# Patient Record
Sex: Female | Born: 1965 | Race: White | Hispanic: No | Smoking: Former smoker
Health system: Southern US, Community
[De-identification: ages and names within clinical notes are randomized; demographics above are authoritative.]

## PROBLEM LIST (undated history)

## (undated) DIAGNOSIS — Z8719 Personal history of other diseases of the digestive system: Secondary | ICD-10-CM

## (undated) DIAGNOSIS — D649 Anemia, unspecified: Secondary | ICD-10-CM

## (undated) DIAGNOSIS — E559 Vitamin D deficiency, unspecified: Secondary | ICD-10-CM

## (undated) DIAGNOSIS — K644 Residual hemorrhoidal skin tags: Secondary | ICD-10-CM

## (undated) DIAGNOSIS — T7840XA Allergy, unspecified, initial encounter: Secondary | ICD-10-CM

## (undated) DIAGNOSIS — M1990 Unspecified osteoarthritis, unspecified site: Secondary | ICD-10-CM

## (undated) DIAGNOSIS — Z9884 Bariatric surgery status: Secondary | ICD-10-CM

## (undated) DIAGNOSIS — K219 Gastro-esophageal reflux disease without esophagitis: Secondary | ICD-10-CM

## (undated) DIAGNOSIS — F419 Anxiety disorder, unspecified: Secondary | ICD-10-CM

## (undated) DIAGNOSIS — F329 Major depressive disorder, single episode, unspecified: Secondary | ICD-10-CM

## (undated) DIAGNOSIS — Z87898 Personal history of other specified conditions: Secondary | ICD-10-CM

## (undated) DIAGNOSIS — E079 Disorder of thyroid, unspecified: Secondary | ICD-10-CM

## (undated) DIAGNOSIS — F32A Depression, unspecified: Secondary | ICD-10-CM

## (undated) HISTORY — PX: OTHER SURGICAL HISTORY: SHX169

## (undated) HISTORY — PX: SPHINCTEROTOMY: SHX5279

## (undated) HISTORY — DX: Disorder of thyroid, unspecified: E07.9

## (undated) HISTORY — PX: ABDOMINAL HYSTERECTOMY: SHX81

## (undated) HISTORY — DX: Residual hemorrhoidal skin tags: K64.4

## (undated) HISTORY — DX: Personal history of other diseases of the digestive system: Z87.19

## (undated) HISTORY — DX: Anemia, unspecified: D64.9

## (undated) HISTORY — DX: Major depressive disorder, single episode, unspecified: F32.9

## (undated) HISTORY — PX: TOOTH EXTRACTION: SUR596

## (undated) HISTORY — PX: BREAST SURGERY: SHX581

## (undated) HISTORY — DX: Personal history of other specified conditions: Z87.898

## (undated) HISTORY — DX: Bariatric surgery status: Z98.84

## (undated) HISTORY — DX: Anxiety disorder, unspecified: F41.9

## (undated) HISTORY — PX: GASTRIC BYPASS: SHX52

## (undated) HISTORY — DX: Vitamin D deficiency, unspecified: E55.9

## (undated) HISTORY — DX: Depression, unspecified: F32.A

## (undated) HISTORY — DX: Unspecified osteoarthritis, unspecified site: M19.90

## (undated) HISTORY — DX: Gastro-esophageal reflux disease without esophagitis: K21.9

## (undated) HISTORY — PX: UPPER GASTROINTESTINAL ENDOSCOPY: SHX188

## (undated) HISTORY — DX: Allergy, unspecified, initial encounter: T78.40XA

---

## 1993-05-17 HISTORY — PX: COLONOSCOPY: SHX174

## 1997-04-16 HISTORY — PX: CHOLECYSTECTOMY: SHX55

## 2001-05-17 HISTORY — PX: GASTRIC BYPASS: SHX52

## 2016-07-16 ENCOUNTER — Encounter: Payer: Self-pay | Admitting: Family Medicine

## 2016-08-24 ENCOUNTER — Ambulatory Visit: Payer: Self-pay | Admitting: Physician Assistant

## 2017-08-05 DIAGNOSIS — F900 Attention-deficit hyperactivity disorder, predominantly inattentive type: Secondary | ICD-10-CM | POA: Diagnosis not present

## 2017-08-05 DIAGNOSIS — R69 Illness, unspecified: Secondary | ICD-10-CM | POA: Diagnosis not present

## 2017-09-11 NOTE — Progress Notes (Deleted)
   Subjective:    Patient ID: Laurie Arnold, female    DOB: [birth year], 52 y.o.   MRN: [id number]  HPI:  Laurie Arnold is here to establish as a new pt.  She is a pleasant 52 year old female. PMH:     Review of Systems     Objective:   Physical Exam        Assessment & Plan:

## 2017-09-12 ENCOUNTER — Ambulatory Visit: Payer: Self-pay | Admitting: Adult Health

## 2017-10-18 ENCOUNTER — Encounter: Payer: Self-pay | Admitting: Adult Health

## 2017-10-18 ENCOUNTER — Ambulatory Visit (INDEPENDENT_AMBULATORY_CARE_PROVIDER_SITE_OTHER): Payer: 59 | Admitting: Adult Health

## 2017-10-18 VITALS — BP 122/82 | HR 82 | Ht 63.5 in | Wt 198.2 lb

## 2017-10-18 DIAGNOSIS — R5383 Other fatigue: Secondary | ICD-10-CM | POA: Diagnosis not present

## 2017-10-18 DIAGNOSIS — K219 Gastro-esophageal reflux disease without esophagitis: Secondary | ICD-10-CM | POA: Insufficient documentation

## 2017-10-18 DIAGNOSIS — Z0000 Encounter for general adult medical examination without abnormal findings: Secondary | ICD-10-CM | POA: Diagnosis not present

## 2017-10-18 DIAGNOSIS — R7989 Other specified abnormal findings of blood chemistry: Secondary | ICD-10-CM | POA: Insufficient documentation

## 2017-10-18 DIAGNOSIS — R945 Abnormal results of liver function studies: Secondary | ICD-10-CM | POA: Diagnosis not present

## 2017-10-18 DIAGNOSIS — K644 Residual hemorrhoidal skin tags: Secondary | ICD-10-CM | POA: Diagnosis not present

## 2017-10-18 DIAGNOSIS — H5789 Other specified disorders of eye and adnexa: Secondary | ICD-10-CM | POA: Diagnosis not present

## 2017-10-18 DIAGNOSIS — F909 Attention-deficit hyperactivity disorder, unspecified type: Secondary | ICD-10-CM | POA: Diagnosis not present

## 2017-10-18 DIAGNOSIS — Z6826 Body mass index (BMI) 26.0-26.9, adult: Secondary | ICD-10-CM | POA: Insufficient documentation

## 2017-10-18 DIAGNOSIS — Z6834 Body mass index (BMI) 34.0-34.9, adult: Secondary | ICD-10-CM

## 2017-10-18 DIAGNOSIS — R69 Illness, unspecified: Secondary | ICD-10-CM | POA: Diagnosis not present

## 2017-10-18 NOTE — Assessment & Plan Note (Signed)
Gastri Bypass 2004 She has gained >40 lbs in the last year Increase water intake, strive for at least 70 ounces/day.   Follow Heart Healthy diet Increase regular exercise.  Recommend at least 30 minutes daily, 5 days per week of walking, jogging, biking, swimming, YouTube/Pinterest workout videos. Referral placed to Nutritionist.

## 2017-10-18 NOTE — Assessment & Plan Note (Signed)
Intermittent OU discharge Recommended to see established Ophthalmologist

## 2017-10-18 NOTE — Assessment & Plan Note (Addendum)
>>  ASSESSMENT AND PLAN FOR OVERWEIGHT WITH BODY MASS INDEX (BMI) OF 26 TO 26.9 IN ADULT WRITTEN ON 10/18/2017  4:38 PM BY Desire Fulp D, NP  She has been on Adderral in past, did not tolerate well. She has been Vyvanse 70mg  QD for >5 years   >>ASSESSMENT AND PLAN FOR BMI 34.0-34.9,ADULT WRITTEN ON 10/18/2017  4:30 PM BY Kito Cuffe, Jinny Blossom, NP  Claudell Kyle Bypass 2004 She has gained >40 lbs in the last year Increase water intake, strive for at least 70 ounces/day.   Follow Heart Healthy diet Increase regular exercise.  Recommend at least 30 minutes daily, 5 days per week of walking, jogging, biking, swimming, YouTube/Pinterest workout videos. Referral placed to Nutritionist.

## 2017-10-18 NOTE — Assessment & Plan Note (Signed)
Hx of elevated LFTs She currently takes 650mg  Acetaminophen Q6H- been taking on/off for years

## 2017-10-18 NOTE — Progress Notes (Addendum)
Subjective:    Patient ID: Laurie Arnold, female    DOB: 1965/05/31, 52 y.o.   MRN: [id number]  HPI: Laurie Arnold is here to establish as a new pt.  She is a pleasant 52 year old female.   PMH:  Anxiety/depression- currently on bupropion 150mg  QD, buspirone 10mg  TID ADHD- did not tolerate Adderral well, currently on Vyvanse 70 mg QD Intermittent OU discharge- she has established Ophthalmologist. Gastric Bypass 2004- reports recent wt gain >40 lbs in last year. Reports frequent loose, clay colored stools. GERD- been on ranitidine 150mg  QD for several yrs 1998- Grave's Disease  Radiation Therapy She has been managed on various dosages of Levothyroxine, last contact with Endocrinology was early 2000. She reports Behavioral Health took over Levothyroxine in 2000s. Currently on Levothyroxine 200mcg  Hospitalized 2015 for bacteremia, following an EGD a few days prior to admission. Hx of elevated LFTs, currently reports frequent Acetaminophen use to treat generalized arthritis. She reports constant, non-productive cough that sig worsens at night. She denies plain water intake, prefers to hydrate with Diet Mnt. Dew She eats a diet rich in saturated fat/CHO She denies formal exercise She denies tobacco/ETOH use She works as Psychologist, educationalpalliative/hospice RN- third shift/weekends She has not had regular health care in years   Patient Care Team    Relationship Specialty Notifications Start End  Valdis Bevill, Jinny BlossomKaty D, NP PCP - General Family Medicine  08/05/17     Patient Active Problem List   Diagnosis Date Noted  . Healthcare maintenance 10/18/2017  . BMI 34.0-34.9,adult 10/18/2017  . External hemorrhoids 10/18/2017  . Attention deficit hyperactivity disorder (ADHD) 10/18/2017  . Chronic GERD 10/18/2017  . Elevated LFTs 10/18/2017     Past Medical History:  Diagnosis Date  . Thyroid disease    hypothyr     Past Surgical History:  Procedure Laterality Date  . ABDOMINAL HYSTERECTOMY    . BREAST  SURGERY     augmentation  . CESAREAN SECTION    . CHOLECYSTECTOMY    . GASTRIC BYPASS    . SPHINCTEROTOMY    . thyroid iodine radiation       Family History  Problem Relation Age of Onset  . Depression Mother   . Heart attack Mother   . Healthy Father   . Healthy Brother      Social History   Substance and Sexual Activity  Drug Use Never     Social History   Substance and Sexual Activity  Alcohol Use Never  . Frequency: Never     Social History   Tobacco Use  Smoking Status Former Smoker  . Packs/day: 0.25  . Years: 7.00  . Pack years: 1.75  . Types: Cigarettes  . Last attempt to quit: 05/17/1993  . Years since quitting: 24.4  Smokeless Tobacco Never Used     Outpatient Encounter Medications as of 10/18/2017  Medication Sig  . buPROPion (WELLBUTRIN XL) 150 MG 24 hr tablet Take 2 tablets by mouth daily.  . busPIRone (BUSPAR) 10 MG tablet Take 1 tablet by mouth 3 (three) times daily.  . cetirizine (ZYRTEC) 10 MG tablet Take 1 tablet by mouth daily.  [name]. levothyroxine (SYNTHROID, LEVOTHROID) 200 MCG tablet Take 1 tablet by mouth daily.  . ranitidine (ZANTAC) 150 MG tablet Take 1 tablet by mouth daily.  [name]. VYVANSE 70 MG capsule Take 1 capsule by mouth daily.   No facility-administered encounter medications on file as of 10/18/2017.     Allergies: Patient has no allergy information on  record.  Body mass index is 34.56 kg/m.  Blood pressure 122/82, pulse 82, height 5' 3.5" (1.613 m), weight 198 lb 3.2 oz (89.9 kg), SpO2 95 %.   Review of Systems  Constitutional: Positive for fatigue. Negative for activity change, appetite change, chills, diaphoresis, fever and unexpected weight change.  HENT: Negative for congestion.   Eyes: Positive for discharge and visual disturbance.  Respiratory: Positive for cough. Negative for chest tightness, shortness of breath, wheezing and stridor.   Cardiovascular: Negative for chest pain, palpitations and leg swelling.   Gastrointestinal: Negative for abdominal distention, abdominal pain, blood in stool, constipation, diarrhea, nausea and vomiting.  Endocrine: Negative for cold intolerance, heat intolerance, polydipsia, polyphagia and polyuria.  Genitourinary: Negative for difficulty urinating and flank pain.  Musculoskeletal: Negative for arthralgias, back pain, gait problem, joint swelling, myalgias, neck pain and neck stiffness.  Skin: Negative for color change, pallor, rash and wound.  Neurological: Negative for dizziness and headaches.  Hematological: Does not bruise/bleed easily.  Psychiatric/Behavioral: Positive for dysphoric mood and sleep disturbance. Negative for confusion, decreased concentration, hallucinations, self-injury and suicidal ideas. The patient is nervous/anxious and is hyperactive.        Objective:   Physical Exam  Constitutional: She is oriented to person, place, and time. She appears well-developed and well-nourished. No distress.  HENT:  Head: Normocephalic and atraumatic.  Right Ear: External ear normal.  Left Ear: External ear normal.  Eyes: Pupils are equal, round, and reactive to light. Conjunctivae and EOM are normal.  Cardiovascular: Normal rate, regular rhythm, normal heart sounds and intact distal pulses.  No murmur heard. Pulmonary/Chest: Effort normal and breath sounds normal. No stridor. No respiratory distress. She has no wheezes. She has no rales. She exhibits no tenderness.  Neurological: She is alert and oriented to person, place, and time.  Skin: Skin is warm and dry. Capillary refill takes less than 2 seconds. No rash noted. She is not diaphoretic. No erythema. There is pallor.  Psychiatric: She has a normal mood and affect. Her behavior is normal. Judgment and thought content normal.  Nursing note and vitals reviewed.     Assessment & Plan:   1. Healthcare maintenance   2. External hemorrhoids   3. BMI 34.0-34.9,adult   4. Attention deficit  hyperactivity disorder (ADHD), unspecified ADHD type   5. Chronic GERD   6. Fatigue, unspecified type   7. Elevated LFTs     Healthcare maintenance Continue all medications as directed. Increase water intake, strive for at least 70 ounces/day.   Follow Heart Healthy diet Increase regular exercise.  Recommend at least 30 minutes daily, 5 days per week of walking, jogging, biking, swimming, YouTube/Pinterest workout videos. Referral placed to GI specialist. Referral placed to Nutritionist. Please schedule complete physical in the next few weeks.  BMI 34.0-34.9,adult Claudell Kyle Bypass 2004 She has gained >40 lbs in the last year Increase water intake, strive for at least 70 ounces/day.   Follow Heart Healthy diet Increase regular exercise.  Recommend at least 30 minutes daily, 5 days per week of walking, jogging, biking, swimming, YouTube/Pinterest workout videos. Referral placed to Nutritionist.   External hemorrhoids GI referral placed  Attention deficit hyperactivity disorder (ADHD) She has been on Adderral in past, did not tolerate well. She has been Vyvanse 70mg  QD for >5 years   Chronic GERD Currently on Ranitidine 150mg  QD Avoid spicy/acidic foods Do not eat large meals Do not eat too closely to bedtime Referral to GI placed   Elevated LFTs Hx  of elevated LFTs She currently takes 650mg  Acetaminophen Q6H- been taking on/off for years    FOLLOW-UP:  Return in about 1 month (around 11/15/2017) for CPE.

## 2017-10-18 NOTE — Assessment & Plan Note (Addendum)
Currently on Ranitidine 150mg  QD Avoid spicy/acidic foods Do not eat large meals Do not eat too closely to bedtime Referral to GI placed

## 2017-10-18 NOTE — Assessment & Plan Note (Signed)
GI referral placed

## 2017-10-18 NOTE — Patient Instructions (Signed)
Mediterranean Diet A Mediterranean diet refers to food and lifestyle choices that are based on the traditions of countries located on the [place]. This way of eating has been shown to help prevent certain conditions and improve outcomes for people who have chronic diseases, like kidney disease and heart disease. What are tips for following this plan? Lifestyle  Cook and eat meals together with your family, when possible.  Drink enough fluid to keep your urine clear or pale yellow.  Be physically active every day. This includes: ? Aerobic exercise like running or swimming. ? Leisure activities like gardening, walking, or housework.  Get 7-8 hours of sleep each night.  If recommended by your health care provider, drink red wine in moderation. This means 1 glass a day for nonpregnant women and 2 glasses a day for men. A glass of wine equals 5 oz (150 mL). Reading food labels  Check the serving size of packaged foods. For foods such as rice and pasta, the serving size refers to the amount of cooked product, not dry.  Check the total fat in packaged foods. Avoid foods that have saturated fat or trans fats.  Check the ingredients list for added sugars, such as corn syrup. Shopping  At the grocery store, buy most of your food from the areas near the walls of the store. This includes: ? Fresh fruits and vegetables (produce). ? Grains, beans, nuts, and seeds. Some of these may be available in unpackaged forms or large amounts (in bulk). ? Fresh seafood. ? Poultry and eggs. ? Low-fat dairy products.  Buy whole ingredients instead of prepackaged foods.  Buy fresh fruits and vegetables in-season from local farmers markets.  Buy frozen fruits and vegetables in resealable bags.  If you do not have access to quality fresh seafood, buy precooked frozen shrimp or canned fish, such as tuna, salmon, or sardines.  Buy small amounts of raw or cooked vegetables, salads, or olives from the  deli or salad bar at your store.  Stock your pantry so you always have certain foods on hand, such as olive oil, canned tuna, canned tomatoes, rice, pasta, and beans. Cooking  Cook foods with extra-virgin olive oil instead of using butter or other vegetable oils.  Have meat as a side dish, and have vegetables or grains as your main dish. This means having meat in small portions or adding small amounts of meat to foods like pasta or stew.  Use beans or vegetables instead of meat in common dishes like chili or lasagna.  Experiment with different cooking methods. Try roasting or broiling vegetables instead of steaming or sauteing them.  Add frozen vegetables to soups, stews, pasta, or rice.  Add nuts or seeds for added healthy fat at each meal. You can add these to yogurt, salads, or vegetable dishes.  Marinate fish or vegetables using olive oil, lemon juice, garlic, and fresh herbs. Meal planning  Plan to eat 1 vegetarian meal one day each week. Try to work up to 2 vegetarian meals, if possible.  Eat seafood 2 or more times a week.  Have healthy snacks readily available, such as: ? Vegetable sticks with hummus. ? Greek yogurt. ? Fruit and nut trail mix.  Eat balanced meals throughout the week. This includes: ? Fruit: 2-3 servings a day ? Vegetables: 4-5 servings a day ? Low-fat dairy: 2 servings a day ? Fish, poultry, or lean meat: 1 serving a day ? Beans and legumes: 2 or more servings a week ? Nuts   and seeds: 1-2 servings a day ? Whole grains: 6-8 servings a day ? Extra-virgin olive oil: 3-4 servings a day  Limit red meat and sweets to only a few servings a month What are my food choices?  Mediterranean diet ? Recommended ? Grains: Whole-grain pasta. Brown rice. Bulgar wheat. Polenta. Couscous. Whole-wheat bread. Orpah Cobb. ? Vegetables: Artichokes. Beets. Broccoli. Cabbage. Carrots. Eggplant. Green beans. Chard. Kale. Spinach. Onions. Leeks. Peas. Squash.  Tomatoes. Peppers. Radishes. ? Fruits: Apples. Apricots. Avocado. Berries. Bananas. Cherries. Dates. Figs. Grapes. Lemons. Melon. Oranges. Peaches. Plums. Pomegranate. ? Meats and other protein foods: Beans. Almonds. Sunflower seeds. Pine nuts. Peanuts. Cod. Salmon. Scallops. Shrimp. Tuna. Tilapia. Clams. Oysters. Eggs. ? Dairy: Low-fat milk. Cheese. Greek yogurt. ? Beverages: Water. Red wine. Herbal tea. ? Fats and oils: Extra virgin olive oil. Avocado oil. Grape seed oil. ? Sweets and desserts: [place] yogurt with honey. Baked apples. Poached pears. Trail mix. ? Seasoning and other foods: Basil. Cilantro. Coriander. Cumin. Mint. Parsley. Sage. Rosemary. Tarragon. Garlic. Oregano. Thyme. Pepper. Balsalmic vinegar. Tahini. Hummus. Tomato sauce. Olives. Mushrooms. ? Limit these ? Grains: Prepackaged pasta or rice dishes. Prepackaged cereal with added sugar. ? Vegetables: Deep fried potatoes (french fries). ? Fruits: Fruit canned in syrup. ? Meats and other protein foods: Beef. Pork. Lamb. Poultry with skin. Hot dogs. Tomasa Blase. ? Dairy: Ice cream. Sour cream. Whole milk. ? Beverages: Juice. Sugar-sweetened soft drinks. Beer. Liquor and spirits. ? Fats and oils: Butter. Canola oil. Vegetable oil. Beef fat (tallow). Lard. ? Sweets and desserts: Cookies. Cakes. Pies. Candy. ? Seasoning and other foods: Mayonnaise. Premade sauces and marinades. ? The items listed may not be a complete list. Talk with your dietitian about what dietary choices are right for you. Summary  The Mediterranean diet includes both food and lifestyle choices.  Eat a variety of fresh fruits and vegetables, beans, nuts, seeds, and whole grains.  Limit the amount of red meat and sweets that you eat.  Talk with your health care provider about whether it is safe for you to drink red wine in moderation. This means 1 glass a day for nonpregnant women and 2 glasses a day for men. A glass of wine equals 5 oz (150 mL). This information  is not intended to replace advice given to you by your health care provider. Make sure you discuss any questions you have with your health care provider. Document Released: 12/25/2015 Document Revised: 01/27/2016 Document Reviewed: 12/25/2015 Elsevier Interactive Patient Education  2018 [employer].   Heartburn Heartburn is a type of pain or discomfort that can happen in the throat or chest. It is often described as a burning pain. It may also cause a bad taste in the mouth. Heartburn may feel worse when you lie down or bend over. It may be caused by stomach contents that move back up (reflux) into the tube that connects the mouth with the stomach (esophagus). Follow these instructions at home: Take these actions to lessen your discomfort and to help avoid problems. Diet  Follow a diet as told by your doctor. You may need to avoid foods and drinks such as: ? Coffee and tea (with or without caffeine). ? Drinks that contain alcohol. ? Energy drinks and sports drinks. ? Carbonated drinks or sodas. ? Chocolate and cocoa. ? Peppermint and mint flavorings. ? Garlic and onions. ? Horseradish. ? Spicy and acidic foods, such as peppers, chili powder, curry powder, vinegar, hot sauces, and BBQ sauce. ? Citrus fruit juices and citrus  fruits, such as oranges, lemons, and limes. ? Tomato-based foods, such as red sauce, chili, salsa, and pizza with red sauce. ? Fried and fatty foods, such as donuts, french fries, potato chips, and high-fat dressings. ? High-fat meats, such as hot dogs, rib eye steak, sausage, ham, and bacon. ? High-fat dairy items, such as whole milk, butter, and cream cheese.  Eat small meals often. Avoid eating large meals.  Avoid drinking large amounts of liquid with your meals.  Avoid eating meals during the 2-3 hours before bedtime.  Avoid lying down right after you eat.  Do not exercise right after you eat. General instructions  Pay attention to any changes in your  symptoms.  Take over-the-counter and prescription medicines only as told by your doctor. Do not take aspirin, ibuprofen, or other NSAIDs unless your doctor says it is okay.  Do not use any tobacco products, including cigarettes, chewing tobacco, and e-cigarettes. If you need help quitting, ask your doctor.  Wear loose clothes. Do not wear anything tight around your waist.  Raise (elevate) the head of your bed about 6 inches (15 cm).  Try to lower your stress. If you need help doing this, ask your doctor.  If you are overweight, lose an amount of weight that is healthy for you. Ask your doctor about a safe weight loss goal.  Keep all follow-up visits as told by your doctor. This is important. Contact a doctor if:  You have new symptoms.  You lose weight and you do not know why it is happening.  You have trouble swallowing, or it hurts to swallow.  You have wheezing or a cough that keeps happening.  Your symptoms do not get better with treatment.  You have heartburn often for more than two weeks. Get help right away if:  You have pain in your arms, neck, jaw, teeth, or back.  You feel sweaty, dizzy, or light-headed.  You have chest pain or shortness of breath.  You throw up (vomit) and your throw up looks like blood or coffee grounds.  Your poop (stool) is bloody or black. This information is not intended to replace advice given to you by your health care provider. Make sure you discuss any questions you have with your health care provider. Document Released: 01/13/2011 Document Revised: 10/09/2015 Document Reviewed: 08/28/2014 Elsevier Interactive Patient Education  2018 [employer]  Exercising to [employer] Minor[employer]ose Weight Exercising can help you to lose weight. In order to lose weight through exercise, you need to do vigorous-intensity exercise. You can tell that you are exercising with vigorous intensity if you are breathing very hard and fast and cannot hold a conversation while  exercising. Moderate-intensity exercise helps to maintain your current weight. You can tell that you are exercising at a moderate level if you have a higher heart rate and faster breathing, but you are still able to hold a conversation. How often should I exercise? Choose an activity that you enjoy and set realistic goals. Your health care provider can help you to make an activity plan that works for you. Exercise regularly as directed by your health care provider. This may include:  Doing resistance training twice each week, such as: ? Push-ups. ? Sit-ups. ? Lifting weights. ? Using resistance bands.  Doing a given intensity of exercise for a given amount of time. Choose from these options: ? 150 minutes of moderate-intensity exercise every week. ? 75 minutes of vigorous-intensity exercise every week. ? A mix of moderate-intensity and vigorous-intensity exercise  every week.  Children, pregnant women, people who are out of shape, people who are overweight, and older adults may need to consult a health care provider for individual recommendations. If you have any sort of medical condition, be sure to consult your health care provider before starting a new exercise program. What are some activities that can help me to lose weight?  Walking at a rate of at least 4.5 miles an hour.  Jogging or running at a rate of 5 miles per hour.  Biking at a rate of at least 10 miles per hour.  Lap swimming.  Roller-skating or in-line skating.  Cross-country skiing.  Vigorous competitive sports, such as football, basketball, and soccer.  Jumping rope.  Aerobic dancing. How can I be more active in my day-to-day activities?  Use the stairs instead of the elevator.  Take a walk during your lunch break.  If you drive, park your car farther away from work or school.  If you take public transportation, get off one stop early and walk the rest of the way.  Make all of your phone calls while  standing up and walking around.  Get up, stretch, and walk around every 30 minutes throughout the day. What guidelines should I follow while exercising?  Do not exercise so much that you hurt yourself, feel dizzy, or get very short of breath.  Consult your health care provider prior to starting a new exercise program.  Wear comfortable clothes and shoes with good support.  Drink plenty of water while you exercise to prevent dehydration or heat stroke. Body water is lost during exercise and must be replaced.  Work out until you breathe faster and your heart beats faster. This information is not intended to replace advice given to you by your health care provider. Make sure you discuss any questions you have with your health care provider. Document Released: 06/05/2010 Document Revised: 10/09/2015 Document Reviewed: 10/04/2013 Elsevier Interactive Patient Education  2018 [employer].  Continue all medications as directed. Increase water intake, strive for at least 70 ounces/day.   Follow Heart Healthy diet Increase regular exercise.  Recommend at least 30 minutes daily, 5 days per week of walking, jogging, biking, swimming, YouTube/Pinterest workout videos. Referral placed to GI specialist. Referral placed to Nutritionist. Please schedule complete physical in the next few weeks. WELCOME TO THE PRACTICE!

## 2017-10-18 NOTE — Assessment & Plan Note (Signed)
Continue all medications as directed. Increase water intake, strive for at least 70 ounces/day.   Follow Heart Healthy diet Increase regular exercise.  Recommend at least 30 minutes daily, 5 days per week of walking, jogging, biking, swimming, YouTube/Pinterest workout videos. Referral placed to GI specialist. Referral placed to Nutritionist. Please schedule complete physical in the next few weeks.

## 2017-10-19 ENCOUNTER — Other Ambulatory Visit (INDEPENDENT_AMBULATORY_CARE_PROVIDER_SITE_OTHER): Payer: 59

## 2017-10-19 ENCOUNTER — Encounter: Payer: Self-pay | Admitting: Internal Medicine

## 2017-10-19 DIAGNOSIS — Z0000 Encounter for general adult medical examination without abnormal findings: Secondary | ICD-10-CM

## 2017-10-19 DIAGNOSIS — R5383 Other fatigue: Secondary | ICD-10-CM | POA: Diagnosis not present

## 2017-10-20 ENCOUNTER — Other Ambulatory Visit: Payer: Self-pay | Admitting: Adult Health

## 2017-10-20 DIAGNOSIS — E039 Hypothyroidism, unspecified: Secondary | ICD-10-CM

## 2017-10-20 DIAGNOSIS — E559 Vitamin D deficiency, unspecified: Secondary | ICD-10-CM

## 2017-10-20 LAB — VITAMIN D 25 HYDROXY (VIT D DEFICIENCY, FRACTURES): Vit D, 25-Hydroxy: 10 ng/mL — ABNORMAL LOW (ref 30.0–100.0)

## 2017-10-20 LAB — CBC WITH DIFFERENTIAL/PLATELET
BASOS ABS: 0 10*3/uL (ref 0.0–0.2)
Basos: 1 %
EOS (ABSOLUTE): 0.3 10*3/uL (ref 0.0–0.4)
Eos: 5 %
Hematocrit: 43.5 % (ref 34.0–46.6)
Hemoglobin: 14.7 g/dL (ref 11.1–15.9)
Immature Grans (Abs): 0 10*3/uL (ref 0.0–0.1)
Immature Granulocytes: 0 %
LYMPHS ABS: 2.1 10*3/uL (ref 0.7–3.1)
Lymphs: 39 %
MCH: 30.3 pg (ref 26.6–33.0)
MCHC: 33.8 g/dL (ref 31.5–35.7)
MCV: 90 fL (ref 79–97)
Monocytes Absolute: 0.6 10*3/uL (ref 0.1–0.9)
Monocytes: 11 %
NEUTROS ABS: 2.3 10*3/uL (ref 1.4–7.0)
Neutrophils: 44 %
PLATELETS: 326 10*3/uL (ref 150–450)
RBC: 4.85 x10E6/uL (ref 3.77–5.28)
RDW: 13.6 % (ref 12.3–15.4)
WBC: 5.3 10*3/uL (ref 3.4–10.8)

## 2017-10-20 LAB — COMPREHENSIVE METABOLIC PANEL
A/G RATIO: 1.5 (ref 1.2–2.2)
ALBUMIN: 4.3 g/dL (ref 3.5–5.5)
ALT: 23 IU/L (ref 0–32)
AST: 31 IU/L (ref 0–40)
Alkaline Phosphatase: 115 IU/L (ref 39–117)
BUN / CREAT RATIO: 25 — AB (ref 9–23)
BUN: 18 mg/dL (ref 6–24)
Bilirubin Total: 0.4 mg/dL (ref 0.0–1.2)
CO2: 21 mmol/L (ref 20–29)
Calcium: 9.2 mg/dL (ref 8.7–10.2)
Chloride: 104 mmol/L (ref 96–106)
Creatinine, Ser: 0.73 mg/dL (ref 0.57–1.00)
GFR calc non Af Amer: 97 mL/min/{1.73_m2} (ref >59)
GFR, EST AFRICAN AMERICAN: 112 mL/min/{1.73_m2} (ref >59)
Globulin, Total: 2.9 g/dL (ref 1.5–4.5)
Glucose: 98 mg/dL (ref 65–99)
POTASSIUM: 5.5 mmol/L — AB (ref 3.5–5.2)
Sodium: 140 mmol/L (ref 134–144)
TOTAL PROTEIN: 7.2 g/dL (ref 6.0–8.5)

## 2017-10-20 LAB — LIPID PANEL
CHOL/HDL RATIO: 3.5 ratio (ref 0.0–4.4)
CHOLESTEROL TOTAL: 217 mg/dL — AB (ref 100–199)
HDL: 62 mg/dL (ref >39)
LDL CALC: 133 mg/dL — AB (ref 0–99)
TRIGLYCERIDES: 108 mg/dL (ref 0–149)
VLDL CHOLESTEROL CAL: 22 mg/dL (ref 5–40)

## 2017-10-20 LAB — HEMOGLOBIN A1C
Est. average glucose Bld gHb Est-mCnc: 108 mg/dL
Hgb A1c MFr Bld: 5.4 % (ref 4.8–5.6)

## 2017-10-20 LAB — TSH: TSH: <0.006 u[IU]/mL — ABNORMAL LOW (ref 0.450–4.500)

## 2017-10-20 MED ORDER — LEVOTHYROXINE SODIUM 100 MCG PO TABS: 100.0000 ug | ORAL_TABLET | Freq: Every day | ORAL | 0 refills | Status: DC

## 2017-10-20 MED ORDER — VITAMIN D (ERGOCALCIFEROL) 1.25 MG (50000 UNIT) PO CAPS: 50000.0000 [IU] | ORAL_CAPSULE | ORAL | 0 refills | Status: DC

## 2017-10-21 ENCOUNTER — Other Ambulatory Visit: Payer: Self-pay | Admitting: Adult Health

## 2017-11-02 ENCOUNTER — Encounter: Payer: Self-pay | Admitting: Adult Health

## 2017-11-07 ENCOUNTER — Ambulatory Visit: Payer: 59 | Admitting: Dietician

## 2017-11-21 ENCOUNTER — Encounter: Payer: Self-pay | Admitting: Adult Health

## 2017-11-22 ENCOUNTER — Other Ambulatory Visit: Payer: Self-pay | Admitting: Adult Health

## 2017-11-22 ENCOUNTER — Other Ambulatory Visit: Payer: Self-pay

## 2017-11-22 MED ORDER — VYVANSE 70 MG PO CAPS: 70.0000 mg | ORAL_CAPSULE | Freq: Every day | ORAL | 0 refills | Status: DC

## 2017-11-28 ENCOUNTER — Encounter: Payer: 59 | Admitting: Adult Health

## 2017-11-28 ENCOUNTER — Other Ambulatory Visit: Payer: Self-pay

## 2017-11-28 ENCOUNTER — Ambulatory Visit (AMBULATORY_SURGERY_CENTER): Payer: Self-pay | Admitting: *Deleted

## 2017-11-28 VITALS — Ht 64.0 in | Wt 202.8 lb

## 2017-11-28 DIAGNOSIS — Z1211 Encounter for screening for malignant neoplasm of colon: Secondary | ICD-10-CM

## 2017-11-28 MED ORDER — NA SULFATE-K SULFATE-MG SULF 17.5-3.13-1.6 GM/177ML PO SOLN: 1.0000 | Freq: Once | ORAL | 0 refills | Status: AC

## 2017-11-28 NOTE — Progress Notes (Signed)
No egg or soy allergy known to patient  No issues with past sedation with any surgeries  or procedures, no intubation problems  No diet pills per patient No home 02 use per patient  No blood thinners per patient  Pt denies issues with constipation  No A fib or A flutter  EMMI video sent to pt's e mail - pt declined  Online Suprep $15 coupon to pt in [place] today  Pt concerned about rectal scar tissue after rectal abcess repair multiple times vs hemorrhoids

## 2017-11-29 ENCOUNTER — Encounter: Payer: Self-pay | Admitting: Internal Medicine

## 2017-12-06 ENCOUNTER — Ambulatory Visit (INDEPENDENT_AMBULATORY_CARE_PROVIDER_SITE_OTHER): Payer: 59 | Admitting: Adult Health

## 2017-12-06 ENCOUNTER — Encounter: Payer: Self-pay | Admitting: Adult Health

## 2017-12-06 VITALS — BP 125/85 | HR 71 | Ht 63.5 in | Wt 202.3 lb

## 2017-12-06 DIAGNOSIS — E7800 Pure hypercholesterolemia, unspecified: Secondary | ICD-10-CM | POA: Diagnosis not present

## 2017-12-06 DIAGNOSIS — F329 Major depressive disorder, single episode, unspecified: Secondary | ICD-10-CM

## 2017-12-06 DIAGNOSIS — F32A Depression, unspecified: Secondary | ICD-10-CM

## 2017-12-06 DIAGNOSIS — Z23 Encounter for immunization: Secondary | ICD-10-CM

## 2017-12-06 DIAGNOSIS — R222 Localized swelling, mass and lump, trunk: Secondary | ICD-10-CM

## 2017-12-06 DIAGNOSIS — Z0000 Encounter for general adult medical examination without abnormal findings: Secondary | ICD-10-CM | POA: Diagnosis not present

## 2017-12-06 DIAGNOSIS — Z1239 Encounter for other screening for malignant neoplasm of breast: Secondary | ICD-10-CM

## 2017-12-06 DIAGNOSIS — Z1231 Encounter for screening mammogram for malignant neoplasm of breast: Secondary | ICD-10-CM | POA: Diagnosis not present

## 2017-12-06 DIAGNOSIS — E039 Hypothyroidism, unspecified: Secondary | ICD-10-CM

## 2017-12-06 DIAGNOSIS — K219 Gastro-esophageal reflux disease without esophagitis: Secondary | ICD-10-CM

## 2017-12-06 DIAGNOSIS — R69 Illness, unspecified: Secondary | ICD-10-CM | POA: Diagnosis not present

## 2017-12-06 MED ORDER — OMEPRAZOLE MAGNESIUM 20 MG PO TBEC: 20.0000 mg | DELAYED_RELEASE_TABLET | Freq: Every day | ORAL | 0 refills | Status: DC

## 2017-12-06 NOTE — Assessment & Plan Note (Signed)
Continue all medications as directed, with two changes- 1) Stop Zantac 2) Start Omeprazole Increase water intake, strive for at least 100 ounces/day.   Follow Mediterranean diet Increase regular exercise.  Recommend at least 30 minutes daily, 5 days per week of walking, jogging, biking, swimming, YouTube/Pinterest workout videos. Abdominal Ultrasound ordered, re: lower abdominal wall mass. Please return for lab draw at your convenience. Follow-up 6 months, sooner if needed.
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
## 2017-12-06 NOTE — Patient Instructions (Signed)
Preventive Care for Adults, Female  A healthy lifestyle and preventive care can promote health and wellness. Preventive health guidelines for women include the following key practices.   A routine yearly physical is a good way to check with your health care provider about your health and preventive screening. It is a chance to share any concerns and updates on your health and to receive a thorough exam.   Visit your dentist for a routine exam and preventive care every 6 months. Brush your teeth twice a day and floss once a day. Good oral hygiene prevents tooth decay and gum disease.   The frequency of eye exams is based on your age, health, family medical history, use of contact lenses, and other factors. Follow your health care provider's recommendations for frequency of eye exams.   Eat a healthy diet. Foods like vegetables, fruits, whole grains, low-fat dairy products, and lean protein foods contain the nutrients you need without too many calories. Decrease your intake of foods high in solid fats, added sugars, and salt. Eat the right amount of calories for you.Get information about a proper diet from your health care provider, if necessary.   Regular physical exercise is one of the most important things you can do for your health. Most adults should get at least 150 minutes of moderate-intensity exercise (any activity that increases your heart rate and causes you to sweat) each week. In addition, most adults need muscle-strengthening exercises on 2 or more days a week.   Maintain a healthy weight. The body mass index (BMI) is a screening tool to identify possible weight problems. It provides an estimate of body fat based on height and weight. Your health care provider can find your BMI, and can help you achieve or maintain a healthy weight.For adults 20 years and older:   - A BMI below 18.5 is considered underweight.   - A BMI of 18.5 to 24.9 is normal.   - A BMI of 25 to 29.9 is  considered overweight.   - A BMI of 30 and above is considered obese.   Maintain normal blood lipids and cholesterol levels by exercising and minimizing your intake of trans and saturated fats.  Eat a balanced diet with plenty of fruit and vegetables. Blood tests for lipids and cholesterol should begin at age 20 and be repeated every 5 years minimum.  If your lipid or cholesterol levels are high, you are over 40, or you are at high risk for heart disease, you may need your cholesterol levels checked more frequently.Ongoing high lipid and cholesterol levels should be treated with medicines if diet and exercise are not working.   If you smoke, find out from your health care provider how to quit. If you do not use tobacco, do not start.   Lung cancer screening is recommended for adults aged 55-80 years who are at high risk for developing lung cancer because of a history of smoking. A yearly low-dose CT scan of the lungs is recommended for people who have at least a 30-pack-year history of smoking and are a current smoker or have quit within the past 15 years. A pack year of smoking is smoking an average of 1 pack of cigarettes a day for 1 year (for example: 1 pack a day for 30 years or 2 packs a day for 15 years). Yearly screening should continue until the smoker has stopped smoking for at least 15 years. Yearly screening should be stopped for people who develop a   health problem that would prevent them from having lung cancer treatment.   If you are pregnant, do not drink alcohol. If you are breastfeeding, be very cautious about drinking alcohol. If you are not pregnant and choose to drink alcohol, do not have more than 1 drink per day. One drink is considered to be 12 ounces (355 mL) of beer, 5 ounces (148 mL) of wine, or 1.5 ounces (44 mL) of liquor.   Avoid use of street drugs. Do not share needles with anyone. Ask for help if you need support or instructions about stopping the use of  drugs.   High blood pressure causes heart disease and increases the risk of stroke. Your blood pressure should be checked at least yearly.  Ongoing high blood pressure should be treated with medicines if weight loss and exercise do not work.   If you are 69-55 years old, ask your health care provider if you should take aspirin to prevent strokes.   Diabetes screening involves taking a blood sample to check your fasting blood sugar level. This should be done once every 3 years, after age 38, if you are within normal weight and without risk factors for diabetes. Testing should be considered at a younger age or be carried out more frequently if you are overweight and have at least 1 risk factor for diabetes.   Breast cancer screening is essential preventive care for women. You should practice "breast self-awareness."  This means understanding the normal appearance and feel of your breasts and may include breast self-examination.  Any changes detected, no matter how small, should be reported to a health care provider.  Women in their 80s and 30s should have a clinical breast exam (CBE) by a health care provider as part of a regular health exam every 1 to 3 years.  After age 66, women should have a CBE every year.  Starting at age 1, women should consider having a mammogram (breast X-ray test) every year.  Women who have a family history of breast cancer should talk to their health care provider about genetic screening.  Women at a high risk of breast cancer should talk to their health care providers about having an MRI and a mammogram every year.   -Breast cancer gene (BRCA)-related cancer risk assessment is recommended for women who have family members with BRCA-related cancers. BRCA-related cancers include breast, ovarian, tubal, and peritoneal cancers. Having family members with these cancers may be associated with an increased risk for harmful changes (mutations) in the breast cancer genes BRCA1 and  BRCA2. Results of the assessment will determine the need for genetic counseling and BRCA1 and BRCA2 testing.   The Pap test is a screening test for cervical cancer. A Pap test can show cell changes on the cervix that might become cervical cancer if left untreated. A Pap test is a procedure in which cells are obtained and examined from the lower end of the uterus (cervix).   - Women should have a Pap test starting at age 57.   - Between ages 90 and 70, Pap tests should be repeated every 2 years.   - Beginning at age 63, you should have a Pap test every 3 years as long as the past 3 Pap tests have been normal.   - Some women have medical problems that increase the chance of getting cervical cancer. Talk to your health care provider about these problems. It is especially important to talk to your health care provider if a  new problem develops soon after your last Pap test. In these cases, your health care provider may recommend more frequent screening and Pap tests.   - The above recommendations are the same for women who have or have not gotten the vaccine for human papillomavirus (HPV).   - If you had a hysterectomy for a problem that was not cancer or a condition that could lead to cancer, then you no longer need Pap tests. Even if you no longer need a Pap test, a regular exam is a good idea to make sure no other problems are starting.   - If you are between ages 36 and 66 years, and you have had normal Pap tests going back 10 years, you no longer need Pap tests. Even if you no longer need a Pap test, a regular exam is a good idea to make sure no other problems are starting.   - If you have had past treatment for cervical cancer or a condition that could lead to cancer, you need Pap tests and screening for cancer for at least 20 years after your treatment.   - If Pap tests have been discontinued, risk factors (such as a new sexual partner) need to be reassessed to determine if screening should  be resumed.   - The HPV test is an additional test that may be used for cervical cancer screening. The HPV test looks for the virus that can cause the cell changes on the cervix. The cells collected during the Pap test can be tested for HPV. The HPV test could be used to screen women aged 70 years and older, and should be used in women of any age who have unclear Pap test results. After the age of 67, women should have HPV testing at the same frequency as a Pap test.   Colorectal cancer can be detected and often prevented. Most routine colorectal cancer screening begins at the age of 57 years and continues through age 26 years. However, your health care provider may recommend screening at an earlier age if you have risk factors for colon cancer. On a yearly basis, your health care provider may provide home test kits to check for hidden blood in the stool.  Use of a small camera at the end of a tube, to directly examine the colon (sigmoidoscopy or colonoscopy), can detect the earliest forms of colorectal cancer. Talk to your health care provider about this at age 23, when routine screening begins. Direct exam of the colon should be repeated every 5 -10 years through age 49 years, unless early forms of pre-cancerous polyps or small growths are found.   People who are at an increased risk for hepatitis B should be screened for this virus. You are considered at high risk for hepatitis B if:  -You were born in a country where hepatitis B occurs often. Talk with your health care provider about which countries are considered high risk.  - Your parents were born in a high-risk country and you have not received a shot to protect against hepatitis B (hepatitis B vaccine).  - You have HIV or AIDS.  - You use needles to inject street drugs.  - You live with, or have sex with, someone who has Hepatitis B.  - You get hemodialysis treatment.  - You take certain medicines for conditions like cancer, organ  transplantation, and autoimmune conditions.   Hepatitis C blood testing is recommended for all people born from [birth year] through 1965 and any individual  with known risks for hepatitis C.   Practice safe sex. Use condoms and avoid high-risk sexual practices to reduce the spread of sexually transmitted infections (STIs). STIs include gonorrhea, chlamydia, syphilis, trichomonas, herpes, HPV, and human immunodeficiency virus (HIV). Herpes, HIV, and HPV are viral illnesses that have no cure. They can result in disability, cancer, and death. Sexually active women aged 25 years and younger should be checked for chlamydia. Older women with new or multiple partners should also be tested for chlamydia. Testing for other STIs is recommended if you are sexually active and at increased risk.   Osteoporosis is a disease in which the bones lose minerals and strength with aging. This can result in serious bone fractures or breaks. The risk of osteoporosis can be identified using a bone density scan. Women ages 65 years and over and women at risk for fractures or osteoporosis should discuss screening with their health care providers. Ask your health care provider whether you should take a calcium supplement or vitamin D to There are also several preventive steps women can take to avoid osteoporosis and resulting fractures or to keep osteoporosis from worsening. -->Recommendations include:  Eat a balanced diet high in fruits, vegetables, calcium, and vitamins.  Get enough calcium. The recommended total intake of is 1,200 mg daily; for best absorption, if taking supplements, divide doses into 250-500 mg doses throughout the day. Of the two types of calcium, calcium carbonate is best absorbed when taken with food but calcium citrate can be taken on an empty stomach.  Get enough vitamin D. NAMS and the National Osteoporosis Foundation recommend at least 1,000 IU per day for women age 50 and over who are at risk of vitamin D  deficiency. Vitamin D deficiency can be caused by inadequate sun exposure (for example, those who live in northern latitudes).  Avoid alcohol and smoking. Heavy alcohol intake (more than 7 drinks per week) increases the risk of falls and hip fracture and women smokers tend to lose bone more rapidly and have lower bone mass than nonsmokers. Stopping smoking is one of the most important changes women can make to improve their health and decrease risk for disease.  Be physically active every day. Weight-bearing exercise (for example, fast walking, hiking, jogging, and weight training) may strengthen bones or slow the rate of bone loss that comes with aging. Balancing and muscle-strengthening exercises can reduce the risk of falling and fracture.  Consider therapeutic medications. Currently, several types of effective drugs are available. Healthcare providers can recommend the type most appropriate for each woman.  Eliminate environmental factors that may contribute to accidents. Falls cause nearly 90% of all osteoporotic fractures, so reducing this risk is an important bone-health strategy. Measures include ample lighting, removing obstructions to walking, using nonskid rugs on floors, and placing mats and/or grab bars in showers.  Be aware of medication side effects. Some common medicines make bones weaker. These include a type of steroid drug called glucocorticoids used for arthritis and asthma, some antiseizure drugs, certain sleeping pills, treatments for endometriosis, and some cancer drugs. An overactive thyroid gland or using too much thyroid hormone for an underactive thyroid can also be a problem. If you are taking these medicines, talk to your doctor about what you can do to help protect your bones.reduce the rate of osteoporosis.    Menopause can be associated with physical symptoms and risks. Hormone replacement therapy is available to decrease symptoms and risks. You should talk to your  health care provider   about whether hormone replacement therapy is right for you.   Use sunscreen. Apply sunscreen liberally and repeatedly throughout the day. You should seek shade when your shadow is shorter than you. Protect yourself by wearing long sleeves, pants, a wide-brimmed hat, and sunglasses year round, whenever you are outdoors.   Once a month, do a whole body skin exam, using a mirror to look at the skin on your back. Tell your health care provider of new moles, moles that have irregular borders, moles that are larger than a pencil eraser, or moles that have changed in shape or color.   -Stay current with required vaccines (immunizations).   Influenza vaccine. All adults should be immunized every year.  Tetanus, diphtheria, and acellular pertussis (Td, Tdap) vaccine. Pregnant women should receive 1 dose of Tdap vaccine during each pregnancy. The dose should be obtained regardless of the length of time since the last dose. Immunization is preferred during the 27th 36th week of gestation. An adult who has not previously received Tdap or who does not know her vaccine status should receive 1 dose of Tdap. This initial dose should be followed by tetanus and diphtheria toxoids (Td) booster doses every 10 years. Adults with an unknown or incomplete history of completing a 3-dose immunization series with Td-containing vaccines should begin or complete a primary immunization series including a Tdap dose. Adults should receive a Td booster every 10 years.  Varicella vaccine. An adult without evidence of immunity to varicella should receive 2 doses or a second dose if she has previously received 1 dose. Pregnant females who do not have evidence of immunity should receive the first dose after pregnancy. This first dose should be obtained before leaving the health care facility. The second dose should be obtained 4 8 weeks after the first dose.  Human papillomavirus (HPV) vaccine. Females aged 13 26  years who have not received the vaccine previously should obtain the 3-dose series. The vaccine is not recommended for use in pregnant females. However, pregnancy testing is not needed before receiving a dose. If a female is found to be pregnant after receiving a dose, no treatment is needed. In that case, the remaining doses should be delayed until after the pregnancy. Immunization is recommended for any person with an immunocompromised condition through the age of 26 years if she did not get any or all doses earlier. During the 3-dose series, the second dose should be obtained 4 8 weeks after the first dose. The third dose should be obtained 24 weeks after the first dose and 16 weeks after the second dose.  Zoster vaccine. One dose is recommended for adults aged 60 years or older unless certain conditions are present.  Measles, mumps, and rubella (MMR) vaccine. Adults born before 1957 generally are considered immune to measles and mumps. Adults born in 1957 or later should have 1 or more doses of MMR vaccine unless there is a contraindication to the vaccine or there is laboratory evidence of immunity to each of the three diseases. A routine second dose of MMR vaccine should be obtained at least 28 days after the first dose for students attending postsecondary schools, health care workers, or international travelers. People who received inactivated measles vaccine or an unknown type of measles vaccine during 1963 1967 should receive 2 doses of MMR vaccine. People who received inactivated mumps vaccine or an unknown type of mumps vaccine before 1979 and are at high risk for mumps infection should consider immunization with 2 doses of   MMR vaccine. For females of childbearing age, rubella immunity should be determined. If there is no evidence of immunity, females who are not pregnant should be vaccinated. If there is no evidence of immunity, females who are pregnant should delay immunization until after pregnancy.  Unvaccinated health care workers born before [birth year] who lack laboratory evidence of measles, mumps, or rubella immunity or laboratory confirmation of disease should consider measles and mumps immunization with 2 doses of MMR vaccine or rubella immunization with 1 dose of MMR vaccine.  Pneumococcal 13-valent conjugate (PCV13) vaccine. When indicated, a person who is uncertain of her immunization history and has no record of immunization should receive the PCV13 vaccine. An adult aged 54 years or older who has certain medical conditions and has not been previously immunized should receive 1 dose of PCV13 vaccine. This PCV13 should be followed with a dose of pneumococcal polysaccharide (PPSV23) vaccine. The PPSV23 vaccine dose should be obtained at least 8 weeks after the dose of PCV13 vaccine. An adult aged 58 years or older who has certain medical conditions and previously received 1 or more doses of PPSV23 vaccine should receive 1 dose of PCV13. The PCV13 vaccine dose should be obtained 1 or more years after the last PPSV23 vaccine dose.  Pneumococcal polysaccharide (PPSV23) vaccine. When PCV13 is also indicated, PCV13 should be obtained first. All adults aged 58 years and older should be immunized. An adult younger than age 65 years who has certain medical conditions should be immunized. Any person who resides in a nursing home or long-term care facility should be immunized. An adult smoker should be immunized. People with an immunocompromised condition and certain other conditions should receive both PCV13 and PPSV23 vaccines. People with human immunodeficiency virus (HIV) infection should be immunized as soon as possible after diagnosis. Immunization during chemotherapy or radiation therapy should be avoided. Routine use of PPSV23 vaccine is not recommended for American Indians, [place] Natives, or people younger than 65 years unless there are medical conditions that require PPSV23 vaccine. When indicated,  people who have unknown immunization and have no record of immunization should receive PPSV23 vaccine. One-time revaccination 5 years after the first dose of PPSV23 is recommended for people aged 70 64 years who have chronic kidney failure, nephrotic syndrome, asplenia, or immunocompromised conditions. People who received 1 2 doses of PPSV23 before age 32 years should receive another dose of PPSV23 vaccine at age 96 years or later if at least 5 years have passed since the previous dose. Doses of PPSV23 are not needed for people immunized with PPSV23 at or after age 55 years.  Meningococcal vaccine. Adults with asplenia or persistent complement component deficiencies should receive 2 doses of quadrivalent meningococcal conjugate (MenACWY-D) vaccine. The doses should be obtained at least 2 months apart. Microbiologists working with certain meningococcal bacteria, [employer] recruits, people at risk during an outbreak, and people who travel to or live in countries with a high rate of meningitis should be immunized. A first-year college student up through age 58 years who is living in a residence hall should receive a dose if she did not receive a dose on or after her 16th birthday. Adults who have certain high-risk conditions should receive one or more doses of vaccine.  Hepatitis A vaccine. Adults who wish to be protected from this disease, have certain high-risk conditions, work with hepatitis A-infected animals, work in hepatitis A research labs, or travel to or work in countries with a high rate of hepatitis A should be  immunized. Adults who were previously unvaccinated and who anticipate close contact with an international adoptee during the first 60 days after arrival in the [place] from a country with a high rate of hepatitis A should be immunized.  Hepatitis B vaccine.  Adults who wish to be protected from this disease, have certain high-risk conditions, may be exposed to blood or other infectious  body fluids, are household contacts or sex partners of hepatitis B positive people, are clients or workers in certain care facilities, or travel to or work in countries with a high rate of hepatitis B should be immunized.  Haemophilus influenzae type b (Hib) vaccine. A previously unvaccinated person with asplenia or sickle cell disease or having a scheduled splenectomy should receive 1 dose of Hib vaccine. Regardless of previous immunization, a recipient of a hematopoietic stem cell transplant should receive a 3-dose series 6 12 months after her successful transplant. Hib vaccine is not recommended for adults with HIV infection.  Preventive Services / Frequency Ages 6 to 39years  Blood pressure check.** / Every 1 to 2 years.  Lipid and cholesterol check.** / Every 5 years beginning at age 39.  Clinical breast exam.** / Every 3 years for women in their 61s and 62s.  BRCA-related cancer risk assessment.** / For women who have family members with a BRCA-related cancer (breast, ovarian, tubal, or peritoneal cancers).  Pap test.** / Every 2 years from ages 47 through 85. Every 3 years starting at age 34 through age 12 or 74 with a history of 3 consecutive normal Pap tests.  HPV screening.** / Every 3 years from ages 46 through ages 43 to 54 with a history of 3 consecutive normal Pap tests.  Hepatitis C blood test.** / For any individual with known risks for hepatitis C.  Skin self-exam. / Monthly.  Influenza vaccine. / Every year.  Tetanus, diphtheria, and acellular pertussis (Tdap, Td) vaccine.** / Consult your health care provider. Pregnant women should receive 1 dose of Tdap vaccine during each pregnancy. 1 dose of Td every 10 years.  Varicella vaccine.** / Consult your health care provider. Pregnant females who do not have evidence of immunity should receive the first dose after pregnancy.  HPV vaccine. / 3 doses over 6 months, if 64 and younger. The vaccine is not recommended for use in  pregnant females. However, pregnancy testing is not needed before receiving a dose.  Measles, mumps, rubella (MMR) vaccine.** / You need at least 1 dose of MMR if you were born in 1957 or later. You may also need a 2nd dose. For females of childbearing age, rubella immunity should be determined. If there is no evidence of immunity, females who are not pregnant should be vaccinated. If there is no evidence of immunity, females who are pregnant should delay immunization until after pregnancy.  Pneumococcal 13-valent conjugate (PCV13) vaccine.** / Consult your health care provider.  Pneumococcal polysaccharide (PPSV23) vaccine.** / 1 to 2 doses if you smoke cigarettes or if you have certain conditions.  Meningococcal vaccine.** / 1 dose if you are age 71 to 39 years and a Market researcher living in a residence hall, or have one of several medical conditions, you need to get vaccinated against meningococcal disease. You may also need additional booster doses.  Hepatitis A vaccine.** / Consult your health care provider.  Hepatitis B vaccine.** / Consult your health care provider.  Haemophilus influenzae type b (Hib) vaccine.** / Consult your health care provider.  Ages 55 to 64years  Blood pressure check.** / Every 1 to 2 years.  Lipid and cholesterol check.** / Every 5 years beginning at age 20 years.  Lung cancer screening. / Every year if you are aged 55 80 years and have a 30-pack-year history of smoking and currently smoke or have quit within the past 15 years. Yearly screening is stopped once you have quit smoking for at least 15 years or develop a health problem that would prevent you from having lung cancer treatment.  Clinical breast exam.** / Every year after age 40 years.  BRCA-related cancer risk assessment.** / For women who have family members with a BRCA-related cancer (breast, ovarian, tubal, or peritoneal cancers).  Mammogram.** / Every year beginning at age 40  years and continuing for as long as you are in good health. Consult with your health care provider.  Pap test.** / Every 3 years starting at age 30 years through age 65 or 70 years with a history of 3 consecutive normal Pap tests.  HPV screening.** / Every 3 years from ages 30 years through ages 65 to 70 years with a history of 3 consecutive normal Pap tests.  Fecal occult blood test (FOBT) of stool. / Every year beginning at age 50 years and continuing until age 75 years. You may not need to do this test if you get a colonoscopy every 10 years.  Flexible sigmoidoscopy or colonoscopy.** / Every 5 years for a flexible sigmoidoscopy or every 10 years for a colonoscopy beginning at age 50 years and continuing until age 75 years.  Hepatitis C blood test.** / For all people born from 1945 through 1965 and any individual with known risks for hepatitis C.  Skin self-exam. / Monthly.  Influenza vaccine. / Every year.  Tetanus, diphtheria, and acellular pertussis (Tdap/Td) vaccine.** / Consult your health care provider. Pregnant women should receive 1 dose of Tdap vaccine during each pregnancy. 1 dose of Td every 10 years.  Varicella vaccine.** / Consult your health care provider. Pregnant females who do not have evidence of immunity should receive the first dose after pregnancy.  Zoster vaccine.** / 1 dose for adults aged 60 years or older.  Measles, mumps, rubella (MMR) vaccine.** / You need at least 1 dose of MMR if you were born in 1957 or later. You may also need a 2nd dose. For females of childbearing age, rubella immunity should be determined. If there is no evidence of immunity, females who are not pregnant should be vaccinated. If there is no evidence of immunity, females who are pregnant should delay immunization until after pregnancy.  Pneumococcal 13-valent conjugate (PCV13) vaccine.** / Consult your health care provider.  Pneumococcal polysaccharide (PPSV23) vaccine.** / 1 to 2 doses if  you smoke cigarettes or if you have certain conditions.  Meningococcal vaccine.** / Consult your health care provider.  Hepatitis A vaccine.** / Consult your health care provider.  Hepatitis B vaccine.** / Consult your health care provider.  Haemophilus influenzae type b (Hib) vaccine.** / Consult your health care provider.  Ages 65 years and over  Blood pressure check.** / Every 1 to 2 years.  Lipid and cholesterol check.** / Every 5 years beginning at age 20 years.  Lung cancer screening. / Every year if you are aged 55 80 years and have a 30-pack-year history of smoking and currently smoke or have quit within the past 15 years. Yearly screening is stopped once you have quit smoking for at least 15 years or develop a health problem that   would prevent you from having lung cancer treatment.  Clinical breast exam.** / Every year after age 103 years.  BRCA-related cancer risk assessment.** / For women who have family members with a BRCA-related cancer (breast, ovarian, tubal, or peritoneal cancers).  Mammogram.** / Every year beginning at age 36 years and continuing for as long as you are in good health. Consult with your health care provider.  Pap test.** / Every 3 years starting at age 5 years through age 85 or 10 years with 3 consecutive normal Pap tests. Testing can be stopped between 65 and 70 years with 3 consecutive normal Pap tests and no abnormal Pap or HPV tests in the past 10 years.  HPV screening.** / Every 3 years from ages 93 years through ages 70 or 45 years with a history of 3 consecutive normal Pap tests. Testing can be stopped between 65 and 70 years with 3 consecutive normal Pap tests and no abnormal Pap or HPV tests in the past 10 years.  Fecal occult blood test (FOBT) of stool. / Every year beginning at age 8 years and continuing until age 45 years. You may not need to do this test if you get a colonoscopy every 10 years.  Flexible sigmoidoscopy or colonoscopy.** /  Every 5 years for a flexible sigmoidoscopy or every 10 years for a colonoscopy beginning at age 69 years and continuing until age 68 years.  Hepatitis C blood test.** / For all people born from [birth year] through 1963 and any individual with known risks for hepatitis C.  Osteoporosis screening.** / A one-time screening for women ages 7 years and over and women at risk for fractures or osteoporosis.  Skin self-exam. / Monthly.  Influenza vaccine. / Every year.  Tetanus, diphtheria, and acellular pertussis (Tdap/Td) vaccine.** / 1 dose of Td every 10 years.  Varicella vaccine.** / Consult your health care provider.  Zoster vaccine.** / 1 dose for adults aged 5 years or older.  Pneumococcal 13-valent conjugate (PCV13) vaccine.** / Consult your health care provider.  Pneumococcal polysaccharide (PPSV23) vaccine.** / 1 dose for all adults aged 74 years and older.  Meningococcal vaccine.** / Consult your health care provider.  Hepatitis A vaccine.** / Consult your health care provider.  Hepatitis B vaccine.** / Consult your health care provider.  Haemophilus influenzae type b (Hib) vaccine.** / Consult your health care provider. ** Family history and personal history of risk and conditions may change your health care provider's recommendations. Document Released: 06/29/2001 Document Revised: 02/21/2013  [facility] Patient Information 2014 [place], [place].   EXERCISE AND DIET:  We recommended that you start or continue a regular exercise program for good health. Regular exercise means any activity that makes your heart beat faster and makes you sweat.  We recommend exercising at least 30 minutes per day at least 3 days a week, preferably 5.  We also recommend a diet low in fat and sugar / carbohydrates.  Inactivity, poor dietary choices and obesity can cause diabetes, heart attack, stroke, and kidney damage, among others.     ALCOHOL AND SMOKING:  Women should limit their alcohol intake to no  more than 7 drinks/beers/glasses of wine (combined, not each!) per week. Moderation of alcohol intake to this level decreases your risk of breast cancer and liver damage.  ( And of course, no recreational drugs are part of a healthy lifestyle.)  Also, you should not be smoking at all or even being exposed to second hand smoke. Most people know smoking can  cause cancer, and various heart and lung diseases, but did you know it also contributes to weakening of your bones?  Aging of your skin?  Yellowing of your teeth and nails?   CALCIUM AND VITAMIN D:  Adequate intake of calcium and Vitamin D are recommended.  The recommendations for exact amounts of these supplements seem to change often, but generally speaking 600 mg of calcium (either carbonate or citrate) and 800 units of Vitamin D per day seems prudent. Certain women may benefit from higher intake of Vitamin D.  If you are among these women, your doctor will have told you during your visit.     PAP SMEARS:  Pap smears, to check for cervical cancer or precancers,  have traditionally been done yearly, although recent scientific advances have shown that most women can have pap smears less often.  However, every woman still should have a physical exam from her gynecologist or primary care physician every year. It will include a breast check, inspection of the vulva and vagina to check for abnormal growths or skin changes, a visual exam of the cervix, and then an exam to evaluate the size and shape of the uterus and ovaries.  And after 50 years of age, a rectal exam is indicated to check for rectal cancers. We will also provide age appropriate advice regarding health maintenance, like when you should have certain vaccines, screening for sexually transmitted diseases, bone density testing, colonoscopy, mammograms, etc.    MAMMOGRAMS:  All women over 65 years old should have a yearly mammogram. Many facilities now offer a "3D" mammogram, which may cost  around $50 extra out of pocket. If possible,  we recommend you accept the option to have the 3D mammogram performed.  It both reduces the number of women who will be called back for extra views which then turn out to be normal, and it is better than the routine mammogram at detecting truly abnormal areas.     COLONOSCOPY:  Colonoscopy to screen for colon cancer is recommended for all women at age 26.  We know, you hate the idea of the prep.  We agree, BUT, having colon cancer and not knowing it is worse!!  Colon cancer so often starts as a polyp that can be seen and removed at colonscopy, which can quite literally save your life!  And if your first colonoscopy is normal and you have no family history of colon cancer, most women don't have to have it again for 10 years.  Once every ten years, you can do something that may end up saving your life, right?  We will be happy to help you get it scheduled when you are ready.  Be sure to check your insurance coverage so you understand how much it will cost.  It may be covered as a preventative service at no cost, but you should check your particular policy.    Mediterranean Diet A Mediterranean diet refers to food and lifestyle choices that are based on the traditions of countries located on the [employer]. This way of eating has been shown to help prevent certain conditions and improve outcomes for people who have chronic diseases, like kidney disease and heart disease. What are tips for following this plan? Lifestyle  Cook and eat meals together with your family, when possible.  Drink enough fluid to keep your urine clear or pale yellow.  Be physically active every day. This includes: ? Aerobic exercise like running or swimming. ? Leisure activities like  gardening, walking, or housework.  Get 7-8 hours of sleep each night.  If recommended by your health care provider, drink red wine in moderation. This means 1 glass a day for nonpregnant women  and 2 glasses a day for men. A glass of wine equals 5 oz (150 mL). Reading food labels  Check the serving size of packaged foods. For foods such as rice and pasta, the serving size refers to the amount of cooked product, not dry.  Check the total fat in packaged foods. Avoid foods that have saturated fat or trans fats.  Check the ingredients list for added sugars, such as corn syrup. Shopping  At the grocery store, buy most of your food from the areas near the walls of the store. This includes: ? Fresh fruits and vegetables (produce). ? Grains, beans, nuts, and seeds. Some of these may be available in unpackaged forms or large amounts (in bulk). ? Fresh seafood. ? Poultry and eggs. ? Low-fat dairy products.  Buy whole ingredients instead of prepackaged foods.  Buy fresh fruits and vegetables in-season from local farmers markets.  Buy frozen fruits and vegetables in resealable bags.  If you do not have access to quality fresh seafood, buy precooked frozen shrimp or canned fish, such as tuna, salmon, or sardines.  Buy small amounts of raw or cooked vegetables, salads, or olives from the deli or salad bar at your store.  Stock your pantry so you always have certain foods on hand, such as olive oil, canned tuna, canned tomatoes, rice, pasta, and beans. Cooking  Cook foods with extra-virgin olive oil instead of using butter or other vegetable oils.  Have meat as a side dish, and have vegetables or grains as your main dish. This means having meat in small portions or adding small amounts of meat to foods like pasta or stew.  Use beans or vegetables instead of meat in common dishes like chili or lasagna.  Experiment with different cooking methods. Try roasting or broiling vegetables instead of steaming or sauteing them.  Add frozen vegetables to soups, stews, pasta, or rice.  Add nuts or seeds for added healthy fat at each meal. You can add these to yogurt, salads, or vegetable  dishes.  Marinate fish or vegetables using olive oil, lemon juice, garlic, and fresh herbs. Meal planning  Plan to eat 1 vegetarian meal one day each week. Try to work up to 2 vegetarian meals, if possible.  Eat seafood 2 or more times a week.  Have healthy snacks readily available, such as: ? Vegetable sticks with hummus. ? [place] yogurt. ? Fruit and nut trail mix.  Eat balanced meals throughout the week. This includes: ? Fruit: 2-3 servings a day ? Vegetables: 4-5 servings a day ? Low-fat dairy: 2 servings a day ? Fish, poultry, or lean meat: 1 serving a day ? Beans and legumes: 2 or more servings a week ? Nuts and seeds: 1-2 servings a day ? Whole grains: 6-8 servings a day ? Extra-virgin olive oil: 3-4 servings a day  Limit red meat and sweets to only a few servings a month What are my food choices?  Mediterranean diet ? Recommended ? Grains: Whole-grain pasta. Brown rice. Bulgar wheat. Polenta. Couscous. Whole-wheat bread. [place]. ? Vegetables: Artichokes. Beets. Broccoli. Cabbage. Carrots. Eggplant. Green beans. Chard. Kale. Spinach. Onions. Leeks. Peas. Squash. Tomatoes. Peppers. Radishes. ? Fruits: Apples. Apricots. Avocado. Berries. Bananas. Cherries. Dates. Figs. Grapes. Lemons. Melon. Oranges. Peaches. Plums. Pomegranate. ? Meats and other protein  foods: Beans. Almonds. Sunflower seeds. Pine nuts. Peanuts. [place]. Salmon. Scallops. Shrimp. [place]. Tilapia. Clams. Oysters. Eggs. ? Dairy: Low-fat milk. Cheese. Greek yogurt. ? Beverages: Water. Red wine. Herbal tea. ? Fats and oils: Extra virgin olive oil. Avocado oil. Grape seed oil. ? Sweets and desserts: [place] yogurt with honey. Baked apples. Poached pears. Trail mix. ? Seasoning and other foods: Basil. Cilantro. Coriander. Cumin. Mint. Parsley. Sage. Rosemary. Tarragon. Garlic. Oregano. Thyme. Pepper. Balsalmic vinegar. Tahini. Hummus. Tomato sauce. Olives. Mushrooms. ? Limit these ? Grains: Prepackaged pasta or  rice dishes. Prepackaged cereal with added sugar. ? Vegetables: Deep fried potatoes (french fries). ? Fruits: Fruit canned in syrup. ? Meats and other protein foods: Beef. Pork. Lamb. Poultry with skin. Hot dogs. [place] Salines. ? Dairy: Ice cream. Sour cream. Whole milk. ? Beverages: Juice. Sugar-sweetened soft drinks. Beer. Liquor and spirits. ? Fats and oils: Butter. Canola oil. Vegetable oil. Beef fat (tallow). Lard. ? Sweets and desserts: Cookies. Cakes. Pies. Candy. ? Seasoning and other foods: Mayonnaise. Premade sauces and marinades. ? The items listed may not be a complete list. Talk with your dietitian about what dietary choices are right for you. Summary  The Mediterranean diet includes both food and lifestyle choices.  Eat a variety of fresh fruits and vegetables, beans, nuts, seeds, and whole grains.  Limit the amount of red meat and sweets that you eat.  Talk with your health care provider about whether it is safe for you to drink red wine in moderation. This means 1 glass a day for nonpregnant women and 2 glasses a day for men. A glass of wine equals 5 oz (150 mL). This information is not intended to replace advice given to you by your health care provider. Make sure you discuss any questions you have with your health care provider. Document Released: 12/25/2015 Document Revised: 01/27/2016 Document Reviewed: 12/25/2015 Elsevier Interactive Patient Education  2018 [employer].   Exercising to [employer] Exercising can help you to lose weight. In order to lose weight through exercise, you need to do vigorous-intensity exercise. You can tell that you are exercising with vigorous intensity if you are breathing very hard and fast and cannot hold a conversation while exercising. Moderate-intensity exercise helps to maintain your current weight. You can tell that you are exercising at a moderate level if you have a higher heart rate and faster breathing, but you are still able to hold a  conversation. How often should I exercise? Choose an activity that you enjoy and set realistic goals. Your health care provider can help you to make an activity plan that works for you. Exercise regularly as directed by your health care provider. This may include:  Doing resistance training twice each week, such as: ? Push-ups. ? Sit-ups. ? Lifting weights. ? Using resistance bands.  Doing a given intensity of exercise for a given amount of time. Choose from these options: ? 150 minutes of moderate-intensity exercise every week. ? 75 minutes of vigorous-intensity exercise every week. ? A mix of moderate-intensity and vigorous-intensity exercise every week.  Children, pregnant women, people who are out of shape, people who are overweight, and older adults may need to consult a health care provider for individual recommendations. If you have any sort of medical condition, be sure to consult your health care provider before starting a new exercise program. What are some activities that can help me to lose weight?  Walking at a rate of at least 4.5 miles an hour.  Jogging or  running at a rate of 5 miles per hour.  Biking at a rate of at least 10 miles per hour.  Lap swimming.  Roller-skating or in-line skating.  Cross-country skiing.  Vigorous competitive sports, such as football, basketball, and soccer.  Jumping rope.  Aerobic dancing. How can I be more active in my day-to-day activities?  Use the stairs instead of the elevator.  Take a walk during your lunch break.  If you drive, park your car farther away from work or school.  If you take public transportation, get off one stop early and walk the rest of the way.  Make all of your phone calls while standing up and walking around.  Get up, stretch, and walk around every 30 minutes throughout the day. What guidelines should I follow while exercising?  Do not exercise so much that you hurt yourself, feel dizzy, or get  very short of breath.  Consult your health care provider prior to starting a new exercise program.  Wear comfortable clothes and shoes with good support.  Drink plenty of water while you exercise to prevent dehydration or heat stroke. Body water is lost during exercise and must be replaced.  Work out until you breathe faster and your heart beats faster. This information is not intended to replace advice given to you by your health care provider. Make sure you discuss any questions you have with your health care provider. Document Released: 06/05/2010 Document Revised: 10/09/2015 Document Reviewed: 10/04/2013 Elsevier Interactive Patient Education  2018 [place] all medications as directed, with two changes- 1) Stop Zantac 2) Start Omeprazole Increase water intake, strive for at least 100 ounces/day.   Follow Mediterranean diet Increase regular exercise.  Recommend at least 30 minutes daily, 5 days per week of walking, jogging, biking, swimming, YouTube/Pinterest workout videos. Abdominal Ultrasound ordered, re: lower abdominal wall mass. Please return for lab draw at your convenience. Follow-up 6 months, sooner if needed. NICE TO SEE YOU!

## 2017-12-06 NOTE — Assessment & Plan Note (Deleted)
The 10-year ASCVD risk score Denman George(Goff DC Montez HagemanJr., et al., 2013) is: 1.1%   Values used to calculate the score:     Age: [age] years     Sex: Female     Is Non-Hispanic African American: No     Diabetic: No     Tobacco smoker: No     Systolic Blood Pressure: 125 mmHg     Is BP treated: No     HDL Cholesterol: 62 mg/dL     Total Cholesterol: 217 mg/dL  LDL-

## 2017-12-06 NOTE — Assessment & Plan Note (Signed)
1) Stop Zantac 2) Start Omeprazole Increase water intake, strive for at least 100 ounces/day.   Follow Mediterranean diet Do not eat close to bedtime

## 2017-12-07 DIAGNOSIS — Z1239 Encounter for other screening for malignant neoplasm of breast: Secondary | ICD-10-CM | POA: Insufficient documentation

## 2017-12-07 DIAGNOSIS — E039 Hypothyroidism, unspecified: Secondary | ICD-10-CM | POA: Insufficient documentation

## 2017-12-07 DIAGNOSIS — F32A Depression, unspecified: Secondary | ICD-10-CM | POA: Insufficient documentation

## 2017-12-07 DIAGNOSIS — F339 Major depressive disorder, recurrent, unspecified: Secondary | ICD-10-CM | POA: Insufficient documentation

## 2017-12-07 DIAGNOSIS — E7800 Pure hypercholesterolemia, unspecified: Secondary | ICD-10-CM | POA: Insufficient documentation

## 2017-12-07 DIAGNOSIS — F329 Major depressive disorder, single episode, unspecified: Secondary | ICD-10-CM | POA: Insufficient documentation

## 2017-12-07 NOTE — Assessment & Plan Note (Signed)
Mass present since 2013 Abd US ordered

## 2017-12-07 NOTE — Assessment & Plan Note (Signed)
Mammogram ordered

## 2017-12-07 NOTE — Assessment & Plan Note (Signed)
10/19/17 TSH <0.006,   reducedlevothyroxine from 200mcg to 100mcg  Thyroid panel rechecked today

## 2017-12-07 NOTE — Assessment & Plan Note (Signed)
The 10-year ASCVD risk score Denman George(Goff [place] Montez HagemanJr., et al., 2013) is: 1.1%   Values used to calculate the score:     Age: [age] years     Sex: Female     Is Non-Hispanic African American: No     Diabetic: No     Tobacco smoker: No     Systolic Blood Pressure: 125 mmHg     Is BP treated: No     HDL Cholesterol: 62 mg/dL     Total Cholesterol: 217 mg/dL  [id number] TLC to normalize levels

## 2017-12-07 NOTE — Assessment & Plan Note (Signed)
Mood stable Declined CBT referral Continue Wellbutrin 150mg QD, Buspirone 10mg  TID

## 2017-12-08 ENCOUNTER — Other Ambulatory Visit: Payer: 59

## 2017-12-09 ENCOUNTER — Other Ambulatory Visit (INDEPENDENT_AMBULATORY_CARE_PROVIDER_SITE_OTHER): Payer: 59

## 2017-12-09 DIAGNOSIS — E559 Vitamin D deficiency, unspecified: Secondary | ICD-10-CM

## 2017-12-09 DIAGNOSIS — Z1231 Encounter for screening mammogram for malignant neoplasm of breast: Secondary | ICD-10-CM

## 2017-12-09 DIAGNOSIS — R222 Localized swelling, mass and lump, trunk: Secondary | ICD-10-CM

## 2017-12-09 DIAGNOSIS — E039 Hypothyroidism, unspecified: Secondary | ICD-10-CM | POA: Diagnosis not present

## 2017-12-09 DIAGNOSIS — Z1239 Encounter for other screening for malignant neoplasm of breast: Secondary | ICD-10-CM

## 2017-12-10 LAB — T4, FREE: FREE T4: 1.54 ng/dL (ref 0.82–1.77)

## 2017-12-10 LAB — TSH: TSH: <0.006 u[IU]/mL — ABNORMAL LOW (ref 0.450–4.500)

## 2017-12-10 LAB — VITAMIN D 25 HYDROXY (VIT D DEFICIENCY, FRACTURES): Vit D, 25-Hydroxy: 23.5 ng/mL — ABNORMAL LOW (ref 30.0–100.0)

## 2017-12-10 LAB — T3: T3 TOTAL: 95 ng/dL (ref 71–180)

## 2017-12-12 ENCOUNTER — Other Ambulatory Visit: Payer: Self-pay | Admitting: Adult Health

## 2017-12-12 DIAGNOSIS — E039 Hypothyroidism, unspecified: Secondary | ICD-10-CM

## 2017-12-12 MED ORDER — LEVOTHYROXINE SODIUM 50 MCG PO TABS: 50.0000 ug | ORAL_TABLET | Freq: Every day | ORAL | 0 refills | Status: DC

## 2017-12-13 ENCOUNTER — Encounter: Payer: 59 | Admitting: Internal Medicine

## 2017-12-14 ENCOUNTER — Other Ambulatory Visit: Payer: Self-pay

## 2017-12-14 DIAGNOSIS — E039 Hypothyroidism, unspecified: Secondary | ICD-10-CM

## 2017-12-25 ENCOUNTER — Other Ambulatory Visit: Payer: Self-pay | Admitting: Adult Health

## 2017-12-26 MED ORDER — VYVANSE 70 MG PO CAPS: 70.0000 mg | ORAL_CAPSULE | Freq: Every day | ORAL | 0 refills | Status: DC

## 2018-01-10 ENCOUNTER — Other Ambulatory Visit: Payer: Self-pay | Admitting: Adult Health

## 2018-01-10 ENCOUNTER — Ambulatory Visit
Admission: RE | Admit: 2018-01-10 | Discharge: 2018-01-10 | Disposition: A | Discharge status: Home / Self Care | Payer: 59 | Source: Ambulatory Visit | Attending: Adult Health | Admitting: Adult Health

## 2018-01-10 DIAGNOSIS — K469 Unspecified abdominal hernia without obstruction or gangrene: Secondary | ICD-10-CM

## 2018-01-10 DIAGNOSIS — R222 Localized swelling, mass and lump, trunk: Secondary | ICD-10-CM

## 2018-01-10 DIAGNOSIS — R1900 Intra-abdominal and pelvic swelling, mass and lump, unspecified site: Secondary | ICD-10-CM | POA: Diagnosis not present

## 2018-01-20 ENCOUNTER — Other Ambulatory Visit: Payer: Self-pay | Admitting: Adult Health

## 2018-01-23 MED ORDER — VYVANSE 70 MG PO CAPS: 70.0000 mg | ORAL_CAPSULE | Freq: Every day | ORAL | 0 refills | Status: DC

## 2018-02-05 ENCOUNTER — Other Ambulatory Visit: Payer: Self-pay | Admitting: Adult Health

## 2018-02-20 ENCOUNTER — Other Ambulatory Visit: Payer: Self-pay | Admitting: Adult Health

## 2018-02-21 MED ORDER — VYVANSE 70 MG PO CAPS: 70.0000 mg | ORAL_CAPSULE | Freq: Every day | ORAL | 0 refills | Status: DC

## 2018-02-27 ENCOUNTER — Encounter: Payer: Self-pay | Admitting: *Deleted

## 2018-02-27 ENCOUNTER — Encounter: Payer: Self-pay | Admitting: Surgery

## 2018-02-27 ENCOUNTER — Ambulatory Visit: Payer: 59 | Admitting: Surgery

## 2018-02-27 ENCOUNTER — Other Ambulatory Visit
Admission: RE | Admit: 2018-02-27 | Discharge: 2018-02-27 | Disposition: A | Discharge status: Home / Self Care | Payer: 59 | Source: Ambulatory Visit | Attending: Surgery | Admitting: Surgery

## 2018-02-27 VITALS — BP 152/88 | HR 80 | Temp 97.7°F | Ht 64.0 in | Wt 208.0 lb

## 2018-02-27 DIAGNOSIS — R1011 Right upper quadrant pain: Secondary | ICD-10-CM | POA: Diagnosis not present

## 2018-02-27 LAB — COMPREHENSIVE METABOLIC PANEL
ALT: 20 U/L (ref 0–44)
AST: 27 U/L (ref 15–41)
Albumin: 4.3 g/dL (ref 3.5–5.0)
Alkaline Phosphatase: 94 U/L (ref 38–126)
Anion gap: 12 (ref 5–15)
BUN: 15 mg/dL (ref 6–20)
CHLORIDE: 102 mmol/L (ref 98–111)
CO2: 26 mmol/L (ref 22–32)
Calcium: 9.5 mg/dL (ref 8.9–10.3)
Creatinine, Ser: 0.85 mg/dL (ref 0.44–1.00)
Glucose, Bld: 97 mg/dL (ref 70–99)
POTASSIUM: 4.5 mmol/L (ref 3.5–5.1)
SODIUM: 140 mmol/L (ref 135–145)
Total Bilirubin: 0.7 mg/dL (ref 0.3–1.2)
Total Protein: 7.8 g/dL (ref 6.5–8.1)

## 2018-02-27 NOTE — Progress Notes (Signed)
i

## 2018-02-27 NOTE — Patient Instructions (Signed)
We have scheduled you for a CT Scan of your Abdomen and Pelvis. This has been scheduled at ______________ at our _________________ location. Please Check-in at __________, 15 minutes prior to your scheduled appointment. If you need to reschedule your Scan, you may do so by calling [phone].  You will need to pick up a prep kit at least 24 hours in advance of your Scan.   Bring a list of medications with you to your appointment and you may have nothing to eat or drink 4 hours prior to your CT Scan.   We will let you know when you could come back to see Dr. Dahlia Byes.

## 2018-02-27 NOTE — Progress Notes (Signed)
Patient is to have the following labs drawn today at [facility]: [place].   Also, patient has been scheduled for a CT abdomen/pelvis with contrast at [facility] ([address]) for 03-06-18 at 3:50 pm (arrive 3:30 pm). Prep: nothing to eat/drink 4 hours prior and pick up prep kit. Patient verbalizes understanding.  The patient will follow up in the office on 03-20-18 at 4 pm for results.
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
## 2018-03-02 ENCOUNTER — Telehealth: Payer: Self-pay | Admitting: Surgery

## 2018-03-02 NOTE — Telephone Encounter (Signed)
Patient has called and would like to know the results of the labs that Dr Everlene Farrier had ordered. She stated that if she is not able to answer her phone, a detailed message can be left on her voicemail. Phone number has been [phone].  Patient also stated that she was not told where to pick up her prep for her CT scan that is scheduled in [place]. I informed her that she should be able to pick up the prep from the imaging center that her scan is scheduled at. If this is incorrect, please advise patient.

## 2018-03-02 NOTE — Telephone Encounter (Signed)
Patient notified that labs were normal per Dr. Everlene Farrier.   The patient also reports that she has picked up the prep for the CT scan. She was instructed to keep this appointment and follow up with Dr. Everlene Farrier for the results as scheduled.

## 2018-03-02 NOTE — Telephone Encounter (Signed)
Her CMP was nml, please let CMA or RN know about her CT and how to set it up

## 2018-03-05 ENCOUNTER — Other Ambulatory Visit: Payer: Self-pay | Admitting: Adult Health

## 2018-03-06 ENCOUNTER — Ambulatory Visit
Admission: RE | Admit: 2018-03-06 | Discharge: 2018-03-06 | Disposition: A | Discharge status: Home / Self Care | Payer: 59 | Source: Ambulatory Visit | Attending: Surgery | Admitting: Surgery

## 2018-03-06 DIAGNOSIS — K439 Ventral hernia without obstruction or gangrene: Secondary | ICD-10-CM | POA: Diagnosis not present

## 2018-03-06 DIAGNOSIS — R1011 Right upper quadrant pain: Secondary | ICD-10-CM

## 2018-03-06 MED ORDER — IOPAMIDOL (ISOVUE-300) INJECTION 61%
100.0000 mL | Freq: Once | INTRAVENOUS | Status: AC | PRN
  Administered 2018-03-06: 100 mL via INTRAVENOUS

## 2018-03-07 ENCOUNTER — Telehealth: Payer: Self-pay | Admitting: *Deleted

## 2018-03-07 NOTE — Telephone Encounter (Signed)
Patient called wanting to know results of CT Scan

## 2018-03-07 NOTE — Telephone Encounter (Signed)
She is aware that he is not in the office today that it will be tomorrow before we call her, agrees and ok to leave a message.

## 2018-03-08 ENCOUNTER — Telehealth: Payer: Self-pay | Admitting: *Deleted

## 2018-03-08 NOTE — Telephone Encounter (Signed)
-----   Message from Leafy Ro, MD sent at 03/07/2018  3:43 PM EDT ----- Please let her know that the CT showed a small hernia. We will need to fix it at some point in time. I will be happy to talk to her onn next F/U appt ----- Message ----- From: Interface, Rad Results In Sent: 03/07/2018   9:10 AM EDT To: Leafy Ro, MD

## 2018-03-08 NOTE — Telephone Encounter (Signed)
Notified patient as instructed, patient pleased. Discussed follow-up appointments, patient agrees  

## 2018-03-14 ENCOUNTER — Other Ambulatory Visit: Payer: 59

## 2018-03-15 ENCOUNTER — Other Ambulatory Visit: Payer: 59

## 2018-03-20 ENCOUNTER — Other Ambulatory Visit: Payer: Self-pay | Admitting: Adult Health

## 2018-03-20 ENCOUNTER — Ambulatory Visit: Payer: 59 | Admitting: Surgery

## 2018-03-21 ENCOUNTER — Encounter: Payer: Self-pay | Admitting: Adult Health

## 2018-03-21 ENCOUNTER — Ambulatory Visit (INDEPENDENT_AMBULATORY_CARE_PROVIDER_SITE_OTHER): Payer: 59 | Admitting: Adult Health

## 2018-03-21 VITALS — BP 130/82 | HR 86 | Temp 98.3°F | Ht 64.0 in | Wt 207.6 lb

## 2018-03-21 DIAGNOSIS — Z0000 Encounter for general adult medical examination without abnormal findings: Secondary | ICD-10-CM

## 2018-03-21 DIAGNOSIS — E039 Hypothyroidism, unspecified: Secondary | ICD-10-CM

## 2018-03-21 DIAGNOSIS — F909 Attention-deficit hyperactivity disorder, unspecified type: Secondary | ICD-10-CM | POA: Diagnosis not present

## 2018-03-21 DIAGNOSIS — R002 Palpitations: Secondary | ICD-10-CM | POA: Diagnosis not present

## 2018-03-21 DIAGNOSIS — R69 Illness, unspecified: Secondary | ICD-10-CM | POA: Diagnosis not present

## 2018-03-21 MED ORDER — VYVANSE 70 MG PO CAPS: 70.0000 mg | ORAL_CAPSULE | Freq: Every day | ORAL | 0 refills | Status: DC
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
## 2018-03-21 NOTE — Assessment & Plan Note (Signed)
We will call you when lab results are available. Referral to Endocrinology placed, re: thyroid disease and significantly suppressed TSH levels. Please make follow-up in 3 weeks to discuss 03/06/18 CT results,  re: Aortic Atherosclerosis Please make 3 month follow-up ADHD medication.

## 2018-03-21 NOTE — Assessment & Plan Note (Signed)
[place] Controlled Substance Database reviewed- No aberrancies noted Vyvanse 70mg  refilled, okay for two more refills prior to next OV in 3 months

## 2018-03-21 NOTE — Assessment & Plan Note (Signed)
Sig reduce caffeine intake 0-1 Soda/day

## 2018-03-21 NOTE — Assessment & Plan Note (Signed)
1998- Grave's Disease  Radiation Therapy She has been managed on various dosages of Levothyroxine, last contact with Endocrinology was early 2000. She reports Behavioral Health took over Levothyroxine in 2000s. 10/2017 she was on Levothyroxine when she came to our practice TSH levels since then have been sig suppressed <0.006 Last Free T4 and T3 were normal Currently on Levothyroxine QD She is now agreeable to referral to Endo

## 2018-03-21 NOTE — Telephone Encounter (Signed)
Good Morning Tonya, Can you please call Ms. [place] and share- Per our new clinical policy from our meeting last week, I will need to she her every 3 months (not every 6 months) to continue therapy. Can you please have her make a quick appt. I will send in one RF to give her a few weeks to get in. Thanks! Orpha Bur

## 2018-03-21 NOTE — Patient Instructions (Addendum)
Living With Attention Deficit Hyperactivity Disorder If you have been diagnosed with attention deficit hyperactivity disorder (ADHD), you may be relieved that you now know why you have felt or behaved a certain way. Still, you may feel overwhelmed about the treatment ahead. You may also wonder how to get the support you need and how to deal with the condition day-to-day. With treatment and support, you can live with ADHD and manage your symptoms. How to manage lifestyle changes Managing stress Stress is your body's reaction to life changes and events, both good and bad. To cope with the stress of an ADHD diagnosis, it may help to:  Learn more about ADHD.  Exercise regularly. Even a short daily walk can lower stress levels.  Participate in training or education programs (including social skills training classes) that teach you to deal with symptoms.  Medicines Your health care provider may suggest certain medicines if he or she feels that they will help to improve your condition. Stimulant medicines are usually prescribed to treat ADHD, and therapy may also be prescribed. It is important to:  Avoid using alcohol and other substances that may prevent your medicines from working properly (mayinteract).  Talk with your pharmacist or health care provider about all the medicines that you take, their possible side effects, and what medicines are safe to take together.  Make it your goal to take part in all treatment decisions (shared decision-making). Ask about possible side effects of medicines that your health care provider recommends, and tell him or her how you feel about having those side effects. It is best if shared decision-making with your health care provider is part of your total treatment plan.  Relationships To strengthen your relationships with family members while treating your condition, consider taking part in family therapy. You might also attend self-help groups alone or with a  loved one. Be honest about how your symptoms affect your relationships. Make an effort to communicate respectfully instead of fighting, and find ways to show others that you care. Psychotherapy may be useful in helping you cope with how ADHD affects your relationships. How to recognize changes in your condition The following signs may mean that your treatment is working well and your condition is improving:  Consistently being on time for appointments.  Being more organized at home and work.  Other people noticing improvements in your behavior.  Achieving goals that you set for yourself.  Thinking more clearly.  The following signs may mean that your treatment is not working very well:  Feeling impatience or more confusion.  Missing, forgetting, or being late for appointments.  An increasing sense of disorganization and messiness.  More difficulty in reaching goals that you set for yourself.  Loved ones becoming angry or frustrated with you.  Where to find support Talking to others  Keep emotion out of important discussions and speak in a calm, logical way.  Listen closely and patiently to your loved ones. Try to understand their point of view, and try to avoid getting defensive.  Take responsibility for the consequences of your actions.  Ask that others do not take your behaviors personally.  Aim to solve problems as they come up, and express your feelings instead of bottling them up.  Talk openly about what you need from your loved ones and how they can support you.  Consider going to family therapy sessions or having your family meet with a specialist who deals with ADHD-related behavior problems. Finances Not all insurance plans cover   mental health care, so it is important to check with your insurance carrier. If paying for co-pays or counseling services is a problem, search for a local or county mental health care center. Public mental health care services may be  offered there at a low cost or no cost when you are not able to see a private health care provider. If you are taking medicine for ADHD, you may be able to get the generic form, which may be less expensive than brand-name medicine. Some makers of prescription medicines also offer help to patients who cannot afford the medicines that they need. Follow these instructions at home:  Take over-the-counter and prescription medicines only as told by your health care provider. Check with your health care provider before taking any new medicines.  Create structure and an organized atmosphere at home. For example: ? Make a list of tasks, then rank them from most important to least important. Work on one task at a time until your listed tasks are done. ? Make a daily schedule and follow it consistently every day. ? Use an appointment calendar, and check it 2 or 3 times a day to keep on track. Keep it with you when you leave the house. ? Create spaces where you keep certain things, and always put things back in their places after you use them.  Keep all follow-up visits as told by your health care provider. This is important. Questions to ask your health care provider:  What are the risks and benefits of taking medicines?  Would I benefit from therapy?  How often should I follow up with a health care provider? Contact a health care provider if:  You have side effects from your medicines, such as: ? Repeated muscle twitches, coughing, or speech outbursts. ? Sleep problems. ? Loss of appetite. ? Depression. ? New or worsening behavior problems. ? Dizziness. ? Unusually fast heartbeat. ? Stomach pains. ? Headaches. Get help right away if:  You have a severe reaction to a medicine.  Your behavior suddenly gets worse. Summary  With treatment and support, you can live with ADHD and manage your symptoms.  The medicines that are most often prescribed for ADHD are stimulants.  Consider taking  part in family therapy or self-help groups with family members or friends.  When you talk with friends and family about your ADHD, be patient and communicate openly.  Take over-the-counter and prescription medicines only as told by your health care provider. Check with your health care provider before taking any new medicines. This information is not intended to replace advice given to you by your health care provider. Make sure you discuss any questions you have with your health care provider. Document Released: 09/02/2016 Document Revised: 09/02/2016 Document Reviewed: 09/02/2016 Elsevier Interactive Patient Education  2018 [employer].   Hypothyroidism Hypothyroidism is a disorder of the thyroid. The thyroid is a large gland that is located in the lower front of the neck. The thyroid releases hormones that control how the body works. With hypothyroidism, the thyroid does not make enough of these hormones. What are the causes? Causes of hypothyroidism may include:  Viral infections.  Pregnancy.  Your own defense system (immune system) attacking your thyroid.  Certain medicines.  Birth defects.  Past radiation treatments to your head or neck.  Past treatment with radioactive iodine.  Past surgical removal of part or all of your thyroid.  Problems with the gland that is located in the center of your brain (pituitary).  What are the signs or symptoms? Signs and symptoms of hypothyroidism may include:  Feeling as though you have no energy (lethargy).  Inability to tolerate cold.  Weight gain that is not explained by a change in diet or exercise habits.  Dry skin.  Coarse hair.  Menstrual irregularity.  Slowing of thought processes.  Constipation.  Sadness or depression.  How is this diagnosed? Your health care provider may diagnose hypothyroidism with blood tests and ultrasound tests. How is this treated? Hypothyroidism is treated with medicine that replaces  the hormones that your body does not make. After you begin treatment, it may take several weeks for symptoms to go away. Follow these instructions at home:  Take medicines only as directed by your health care provider.  If you start taking any new medicines, tell your health care provider.  Keep all follow-up visits as directed by your health care provider. This is important. As your condition improves, your dosage needs may change. You will need to have blood tests regularly so that your health care provider can watch your condition. Contact a health care provider if:  Your symptoms do not get better with treatment.  You are taking thyroid replacement medicine and: ? You sweat excessively. ? You have tremors. ? You feel anxious. ? You lose weight rapidly. ? You cannot tolerate heat. ? You have emotional swings. ? You have diarrhea. ? You feel weak. Get help right away if:  You develop chest pain.  You develop an irregular heartbeat.  You develop a rapid heartbeat. This information is not intended to replace advice given to you by your health care provider. Make sure you discuss any questions you have with your health care provider. Document Released: 05/03/2005 Document Revised: 10/09/2015 Document Reviewed: 09/18/2013 Elsevier Interactive Patient Education  2018 [employer].  We will call you when lab results are available. Referral to Endocrinology placed, re: thyroid disease and significantly suppressed TSH levels. Please make follow-up in 3 weeks to discuss 03/06/18 CT results,  re: Aortic Atherosclerosis Please make 3 month follow-up ADHD medication. NICE TO SEE YOU!

## 2018-03-22 ENCOUNTER — Encounter: Payer: Self-pay | Admitting: Adult Health

## 2018-03-22 ENCOUNTER — Other Ambulatory Visit: Payer: Self-pay | Admitting: Adult Health

## 2018-03-22 DIAGNOSIS — E039 Hypothyroidism, unspecified: Secondary | ICD-10-CM

## 2018-03-22 LAB — TSH: TSH: 24.18 u[IU]/mL — AB (ref 0.450–4.500)

## 2018-03-22 MED ORDER — LEVOTHYROXINE SODIUM 75 MCG PO TABS: 75.0000 ug | ORAL_TABLET | Freq: Every day | ORAL | 0 refills | Status: DC

## 2018-03-31 ENCOUNTER — Other Ambulatory Visit: Payer: Self-pay | Admitting: Adult Health

## 2018-04-14 NOTE — Progress Notes (Deleted)
   Subjective:    Patient ID: Laurie Arnold, female    DOB: 1966-05-14, 52 y.o.   MRN: [id number]  HPI:  Laurie Arnold is here to discuss CT results, imaging ordered by her Surgeon.  03/06/18 CT Results: IMPRESSION: 1. Fat containing ventral abdominal wall hernia. No bowel involvement. 2.  No acute process in the abdomen or pelvis. 3.  Aortic Atherosclerosis (ICD10-I70.0).  Review of Systems     Objective:   Physical Exam        Assessment & Plan:  No diagnosis found.  No problem-specific Assessment & Plan notes found for this encounter.    FOLLOW-UP:  No follow-ups on file.

## 2018-04-17 ENCOUNTER — Ambulatory Visit: Payer: 59 | Admitting: Adult Health

## 2018-04-20 ENCOUNTER — Other Ambulatory Visit: Payer: Self-pay | Admitting: Adult Health

## 2018-04-20 MED ORDER — VYVANSE 70 MG PO CAPS: 70.0000 mg | ORAL_CAPSULE | Freq: Every day | ORAL | 0 refills | Status: DC

## 2018-04-20 NOTE — Telephone Encounter (Signed)
[place] Controlled Substance dabase reviewed.  No aberancies noted.   Tiajuana Amass. Nelson, CMA

## 2018-05-04 ENCOUNTER — Other Ambulatory Visit: Payer: 59

## 2018-05-04 ENCOUNTER — Ambulatory Visit: Payer: 59 | Admitting: Adult Health

## 2018-05-05 ENCOUNTER — Other Ambulatory Visit: Payer: 59

## 2018-05-05 DIAGNOSIS — E039 Hypothyroidism, unspecified: Secondary | ICD-10-CM

## 2018-05-06 LAB — TSH: TSH: 12.04 u[IU]/mL — AB (ref 0.450–4.500)

## 2018-05-08 ENCOUNTER — Other Ambulatory Visit: Payer: Self-pay | Admitting: Adult Health

## 2018-05-08 MED ORDER — LEVOTHYROXINE SODIUM 88 MCG PO TABS: 88.0000 ug | ORAL_TABLET | Freq: Every day | ORAL | 0 refills | Status: DC

## 2018-05-22 ENCOUNTER — Other Ambulatory Visit: Payer: Self-pay | Admitting: Adult Health

## 2018-05-22 MED ORDER — VYVANSE 70 MG PO CAPS: 70.0000 mg | ORAL_CAPSULE | Freq: Every day | ORAL | 0 refills | Status: DC

## 2018-05-22 NOTE — Telephone Encounter (Signed)
[place] Controlled Substance Database reviewed.  No aberrancies noted.  T. Nelson, CMA  

## 2018-05-23 ENCOUNTER — Encounter: Payer: Self-pay | Admitting: *Deleted

## 2018-05-23 ENCOUNTER — Ambulatory Visit: Payer: 59 | Admitting: Internal Medicine

## 2018-05-28 ENCOUNTER — Other Ambulatory Visit: Payer: Self-pay | Admitting: Adult Health

## 2018-05-29 NOTE — Telephone Encounter (Signed)
Is patient to continue this medication indefinitely or does she need repeat labs prior to refill?  Tiajuana Amass. Nelson, CMA

## 2018-05-29 NOTE — Telephone Encounter (Signed)
We would need to recheck Vit d level prior to ergocalciferol refill We can check next time the pt is in clinic Thanks! Orpha BurKaty

## 2018-05-30 ENCOUNTER — Ambulatory Visit: Payer: 59 | Admitting: Internal Medicine

## 2018-05-31 ENCOUNTER — Other Ambulatory Visit: Payer: Self-pay

## 2018-05-31 ENCOUNTER — Encounter: Payer: Self-pay | Admitting: Adult Health

## 2018-05-31 MED ORDER — BUSPIRONE HCL 10 MG PO TABS: 10.0000 mg | ORAL_TABLET | Freq: Three times a day (TID) | ORAL | 0 refills | Status: DC

## 2018-05-31 MED ORDER — BUPROPION HCL ER (XL) 150 MG PO TB24: 300.0000 mg | ORAL_TABLET | Freq: Every day | ORAL | 0 refills | Status: DC

## 2018-05-31 NOTE — Telephone Encounter (Signed)
Received the following MyChart message from patient:  Message   Hi! I had mentioned during a previous appointment that I was finishing up my Wellbutrin and Buspar medication that I still had available in my home.Well, I no longer have any remaining and need new prescriptions sent to my pharmacy if you don't mind. I haven't had these filled at this pharmacy before because I used a mail order pharmacy in the past. Let me know if you need me anything further.     Thank you!  Revonda Standard Bockrath   Please review for appropriateness of refills.  Tiajuana Amass, CMA

## 2018-06-02 ENCOUNTER — Encounter: Payer: Self-pay | Admitting: Adult Health

## 2018-06-02 ENCOUNTER — Other Ambulatory Visit: Payer: Self-pay

## 2018-06-02 MED ORDER — BUPROPION HCL ER (XL) 300 MG PO TB24: 300.0000 mg | ORAL_TABLET | Freq: Every day | ORAL | 0 refills | Status: DC

## 2018-06-02 NOTE — Progress Notes (Signed)
MyChart message received from patient:  Message   Hey! The order is to take (2) 150 mg tablets (30O mg) by mouth daily. Bupropion is available in 300 mg tablets so they just need clarification and a new order to take (1) Bupropion 30O mg tablet by mouth daily. *For insurance purposes. Thanks!   New RX sent to pharmacy for 300mg  strength.  Pt informed via MyChart.  Tiajuana Amass, CMA
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
## 2018-06-05 ENCOUNTER — Ambulatory Visit (INDEPENDENT_AMBULATORY_CARE_PROVIDER_SITE_OTHER): Payer: 59 | Admitting: Adult Health

## 2018-06-05 ENCOUNTER — Encounter: Payer: Self-pay | Admitting: Adult Health

## 2018-06-05 VITALS — BP 123/87 | HR 70 | Temp 98.8°F | Ht 64.0 in | Wt 206.8 lb

## 2018-06-05 DIAGNOSIS — K589 Irritable bowel syndrome without diarrhea: Secondary | ICD-10-CM | POA: Diagnosis not present

## 2018-06-05 DIAGNOSIS — E7800 Pure hypercholesterolemia, unspecified: Secondary | ICD-10-CM

## 2018-06-05 DIAGNOSIS — R69 Illness, unspecified: Secondary | ICD-10-CM | POA: Diagnosis not present

## 2018-06-05 DIAGNOSIS — E559 Vitamin D deficiency, unspecified: Secondary | ICD-10-CM

## 2018-06-05 DIAGNOSIS — F32A Depression, unspecified: Secondary | ICD-10-CM

## 2018-06-05 DIAGNOSIS — F329 Major depressive disorder, single episode, unspecified: Secondary | ICD-10-CM

## 2018-06-05 DIAGNOSIS — Z1211 Encounter for screening for malignant neoplasm of colon: Secondary | ICD-10-CM

## 2018-06-05 NOTE — Assessment & Plan Note (Signed)
The 10-year ASCVD risk score Denman George DC Montez Hageman., et al., 2013) is: 1.1%   Values used to calculate the score:     Age: 53 years     Sex: Female     Is Non-Hispanic African American: No     Diabetic: No     Tobacco smoker: No     Systolic Blood Pressure: 123 mmHg     Is BP treated: No     HDL Cholesterol: 62 mg/dL     Total Cholesterol: 217 mg/dL  [id number] Follow Mediterranean Diet Increase exercise

## 2018-06-05 NOTE — Assessment & Plan Note (Signed)
[place] Controlled Substance Database reviewed- no aberrancies noted Well controlled on Vyvanse 70mg  QD

## 2018-06-05 NOTE — Assessment & Plan Note (Signed)
Increase water intake, strive for at least 100 ounces/day.   Follow Mediterranean diet Increase regular exercise.  Recommend at least 30 minutes daily, 5 days per week of walking, jogging, biking, swimming, YouTube/Pinterest workout videos. Follow-up 3 months, ADHD medications. Continue all medications as directed. Referral to Gastroenterologist, re: colonoscopy, IBS We will call you with lab results. If you ever have lab billing question, please call clinic.

## 2018-06-05 NOTE — Assessment & Plan Note (Signed)
Mood stable Continue Bupropion 300mg  QD

## 2018-06-05 NOTE — Patient Instructions (Addendum)
Mediterranean Diet A Mediterranean diet refers to food and lifestyle choices that are based on the traditions of countries located on the [employer]. This way of eating has been shown to help prevent certain conditions and improve outcomes for people who have chronic diseases, like kidney disease and heart disease. What are tips for following this plan? Lifestyle  Cook and eat meals together with your family, when possible.  Drink enough fluid to keep your urine clear or pale yellow.  Be physically active every day. This includes: ? Aerobic exercise like running or swimming. ? Leisure activities like gardening, walking, or housework.  Get 7-8 hours of sleep each night.  If recommended by your health care provider, drink red wine in moderation. This means 1 glass a day for nonpregnant women and 2 glasses a day for men. A glass of wine equals 5 oz (150 mL). Reading food labels   Check the serving size of packaged foods. For foods such as rice and pasta, the serving size refers to the amount of cooked product, not dry.  Check the total fat in packaged foods. Avoid foods that have saturated fat or trans fats.  Check the ingredients list for added sugars, such as corn syrup. Shopping  At the grocery store, buy most of your food from the areas near the walls of the store. This includes: ? Fresh fruits and vegetables (produce). ? Grains, beans, nuts, and seeds. Some of these may be available in unpackaged forms or large amounts (in bulk). ? Fresh seafood. ? Poultry and eggs. ? Low-fat dairy products.  Buy whole ingredients instead of prepackaged foods.  Buy fresh fruits and vegetables in-season from local farmers markets.  Buy frozen fruits and vegetables in resealable bags.  If you do not have access to quality fresh seafood, buy precooked frozen shrimp or canned fish, such as tuna, salmon, or sardines.  Buy small amounts of raw or cooked vegetables, salads, or olives from  the deli or salad bar at your store.  Stock your pantry so you always have certain foods on hand, such as olive oil, canned tuna, canned tomatoes, rice, pasta, and beans. Cooking  Cook foods with extra-virgin olive oil instead of using butter or other vegetable oils.  Have meat as a side dish, and have vegetables or grains as your main dish. This means having meat in small portions or adding small amounts of meat to foods like pasta or stew.  Use beans or vegetables instead of meat in common dishes like chili or lasagna.  Experiment with different cooking methods. Try roasting or broiling vegetables instead of steaming or sauteing them.  Add frozen vegetables to soups, stews, pasta, or rice.  Add nuts or seeds for added healthy fat at each meal. You can add these to yogurt, salads, or vegetable dishes.  Marinate fish or vegetables using olive oil, lemon juice, garlic, and fresh herbs. Meal planning   Plan to eat 1 vegetarian meal one day each week. Try to work up to 2 vegetarian meals, if possible.  Eat seafood 2 or more times a week.  Have healthy snacks readily available, such as: ? Vegetable sticks with hummus. ? [place] yogurt. ? Fruit and nut trail mix.  Eat balanced meals throughout the week. This includes: ? Fruit: 2-3 servings a day ? Vegetables: 4-5 servings a day ? Low-fat dairy: 2 servings a day ? Fish, poultry, or lean meat: 1 serving a day ? Beans and legumes: 2 or more servings a week ?  Nuts and seeds: 1-2 servings a day ? Whole grains: 6-8 servings a day ? Extra-virgin olive oil: 3-4 servings a day  Limit red meat and sweets to only a few servings a month What are my food choices?  Mediterranean diet ? Recommended ? Grains: Whole-grain pasta. Brown rice. Bulgar wheat. Polenta. Couscous. Whole-wheat bread. Orpah Cobb. ? Vegetables: Artichokes. Beets. Broccoli. Cabbage. Carrots. Eggplant. Green beans. Chard. Kale. Spinach. Onions. Leeks. Peas. Squash.  Tomatoes. Peppers. Radishes. ? Fruits: Apples. Apricots. Avocado. Berries. Bananas. Cherries. Dates. Figs. Grapes. Lemons. Melon. Oranges. Peaches. Plums. Pomegranate. ? Meats and other protein foods: Beans. Almonds. Sunflower seeds. Pine nuts. Peanuts. Cod. Salmon. Scallops. Shrimp. Tuna. Tilapia. Clams. Oysters. Eggs. ? Dairy: Low-fat milk. Cheese. Greek yogurt. ? Beverages: Water. Red wine. Herbal tea. ? Fats and oils: Extra virgin olive oil. Avocado oil. Grape seed oil. ? Sweets and desserts: [place] yogurt with honey. Baked apples. Poached pears. Trail mix. ? Seasoning and other foods: Basil. Cilantro. Coriander. Cumin. Mint. Parsley. Sage. Rosemary. Tarragon. Garlic. Oregano. Thyme. Pepper. Balsalmic vinegar. Tahini. Hummus. Tomato sauce. Olives. Mushrooms. ? Limit these ? Grains: Prepackaged pasta or rice dishes. Prepackaged cereal with added sugar. ? Vegetables: Deep fried potatoes (french fries). ? Fruits: Fruit canned in syrup. ? Meats and other protein foods: Beef. Pork. Lamb. Poultry with skin. Hot dogs. Tomasa Blase. ? Dairy: Ice cream. Sour cream. Whole milk. ? Beverages: Juice. Sugar-sweetened soft drinks. Beer. Liquor and spirits. ? Fats and oils: Butter. Canola oil. Vegetable oil. Beef fat (tallow). Lard. ? Sweets and desserts: Cookies. Cakes. Pies. Candy. ? Seasoning and other foods: Mayonnaise. Premade sauces and marinades. ? The items listed may not be a complete list. Talk with your dietitian about what dietary choices are right for you. Summary  The Mediterranean diet includes both food and lifestyle choices.  Eat a variety of fresh fruits and vegetables, beans, nuts, seeds, and whole grains.  Limit the amount of red meat and sweets that you eat.  Talk with your health care provider about whether it is safe for you to drink red wine in moderation. This means 1 glass a day for nonpregnant women and 2 glasses a day for men. A glass of wine equals 5 oz (150 mL). This information  is not intended to replace advice given to you by your health care provider. Make sure you discuss any questions you have with your health care provider. Document Released: 12/25/2015 Document Revised: 01/27/2016 Document Reviewed: 12/25/2015 Elsevier Interactive Patient Education  2019 Elsevier Inc.  Increase water intake, strive for at least 100 ounces/day.   Follow Mediterranean diet Increase regular exercise.  Recommend at least 30 minutes daily, 5 days per week of walking, jogging, biking, swimming, YouTube/Pinterest workout videos. Follow-up 3 months, ADHD medications. Continue all medications as directed. Referral to Gastroenterologist, re: colonoscopy, IBS We will call you with lab results. If you ever have lab billing question, please call clinic. PLEASE TAKE TIME FOR YOURSELF! GREAT TO SEE YOU!

## 2018-06-06 ENCOUNTER — Other Ambulatory Visit: Payer: Self-pay | Admitting: Adult Health

## 2018-06-06 LAB — VITAMIN D 25 HYDROXY (VIT D DEFICIENCY, FRACTURES): Vit D, 25-Hydroxy: 33.1 ng/mL (ref 30.0–100.0)

## 2018-06-06 MED ORDER — VITAMIN D (ERGOCALCIFEROL) 1.25 MG (50000 UNIT) PO CAPS: 50000.0000 [IU] | ORAL_CAPSULE | ORAL | 3 refills | Status: DC

## 2018-06-13 ENCOUNTER — Ambulatory Visit: Payer: 59 | Admitting: Internal Medicine

## 2018-06-21 ENCOUNTER — Other Ambulatory Visit: Payer: Self-pay | Admitting: Adult Health

## 2018-06-21 MED ORDER — VYVANSE 70 MG PO CAPS: 70.0000 mg | ORAL_CAPSULE | Freq: Every day | ORAL | 0 refills | Status: DC

## 2018-06-21 NOTE — Telephone Encounter (Signed)
Controlled Substance Database reviewed.  No aberrancies noted.  T. Nelson, CMA  

## 2018-06-26 ENCOUNTER — Ambulatory Visit: Payer: 59 | Admitting: Internal Medicine

## 2018-07-04 ENCOUNTER — Ambulatory Visit: Payer: 59 | Admitting: Internal Medicine

## 2018-07-11 ENCOUNTER — Ambulatory Visit: Payer: 59 | Admitting: Internal Medicine

## 2018-07-11 ENCOUNTER — Encounter: Payer: Self-pay | Admitting: Adult Health

## 2018-07-24 ENCOUNTER — Other Ambulatory Visit: Payer: Self-pay | Admitting: Adult Health

## 2018-07-24 NOTE — Telephone Encounter (Signed)
[place] Controlled Substance Database reviewed.  No aberrancies noted.  T. Tydus Sanmiguel, CMA  

## 2018-07-25 MED ORDER — VYVANSE 70 MG PO CAPS: 70.0000 mg | ORAL_CAPSULE | Freq: Every day | ORAL | 0 refills | Status: DC

## 2018-07-25 MED ORDER — BUSPIRONE HCL 10 MG PO TABS: 10.0000 mg | ORAL_TABLET | Freq: Three times a day (TID) | ORAL | 0 refills | Status: DC

## 2018-07-25 MED ORDER — BUPROPION HCL ER (XL) 300 MG PO TB24: 300.0000 mg | ORAL_TABLET | Freq: Every day | ORAL | 0 refills | Status: DC

## 2018-07-25 NOTE — Telephone Encounter (Signed)
[place] Controlled Substance Database reviewed.  No aberrancies noted.  T. Nelson, CMA  

## 2018-07-28 ENCOUNTER — Telehealth: Payer: Self-pay

## 2018-07-28 DIAGNOSIS — E039 Hypothyroidism, unspecified: Secondary | ICD-10-CM

## 2018-07-28 NOTE — Telephone Encounter (Signed)
Pt returned call stating that she has been unable to see endocrinologist as of yet.  Advised pt that she will need to come to our office for a TSH prior to any refills.   Advised pt that we can prescribe an additional

## 2018-07-28 NOTE — Telephone Encounter (Signed)
Continuation of previous phone note:  Advised pt that we can prescribe an additional 90 day supply until pt is seen by endocrinology.  New referral placed for endocrinologist.  Tiajuana Amass, CMA

## 2018-07-31 ENCOUNTER — Other Ambulatory Visit (INDEPENDENT_AMBULATORY_CARE_PROVIDER_SITE_OTHER): Payer: 59

## 2018-07-31 ENCOUNTER — Other Ambulatory Visit: Payer: Self-pay

## 2018-07-31 DIAGNOSIS — E039 Hypothyroidism, unspecified: Secondary | ICD-10-CM

## 2018-07-31 NOTE — Addendum Note (Signed)
Addended by: Leda Min D on: 07/31/2018 11:06 AM   Modules accepted: Orders, Level of Service

## 2018-08-01 ENCOUNTER — Other Ambulatory Visit: Payer: Self-pay | Admitting: Adult Health

## 2018-08-01 LAB — TSH: TSH: 1.89 u[IU]/mL (ref 0.450–4.500)

## 2018-08-01 MED ORDER — LEVOTHYROXINE SODIUM 88 MCG PO TABS: 88.0000 ug | ORAL_TABLET | Freq: Every day | ORAL | 0 refills | Status: DC

## 2018-08-11 ENCOUNTER — Encounter: Payer: Self-pay | Admitting: Adult Health

## 2018-08-21 ENCOUNTER — Other Ambulatory Visit: Payer: Self-pay | Admitting: Adult Health

## 2018-08-22 MED ORDER — VYVANSE 70 MG PO CAPS: 70.0000 mg | ORAL_CAPSULE | Freq: Every day | ORAL | 0 refills | Status: DC
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
## 2018-08-29 ENCOUNTER — Ambulatory Visit (INDEPENDENT_AMBULATORY_CARE_PROVIDER_SITE_OTHER): Payer: 59 | Admitting: Adult Health

## 2018-08-29 ENCOUNTER — Encounter: Payer: Self-pay | Admitting: Adult Health

## 2018-08-29 ENCOUNTER — Other Ambulatory Visit: Payer: Self-pay

## 2018-08-29 DIAGNOSIS — Z0000 Encounter for general adult medical examination without abnormal findings: Secondary | ICD-10-CM

## 2018-08-29 DIAGNOSIS — R69 Illness, unspecified: Secondary | ICD-10-CM | POA: Diagnosis not present

## 2018-08-29 DIAGNOSIS — F909 Attention-deficit hyperactivity disorder, unspecified type: Secondary | ICD-10-CM

## 2018-08-29 NOTE — Assessment & Plan Note (Signed)
Assessment and Plan: Continue all medications as directed Increase water intake and continue to avoid soda  Follow Mediterranean Diet Increase regular exercise.  Recommend at least 30 minutes daily, 5 days per week of walking, jogging, biking, swimming, YouTube/Pinterest workout videos.  Follow Up Instructions: OV 3 months   I discussed the assessment and treatment plan with the patient. The patient was provided an opportunity to ask questions and all were answered. The patient agreed with the plan and demonstrated an understanding of the instructions.   The patient was advised to call back or seek an in-person evaluation if the symptoms worsen or if the condition fails to improve as anticipated.

## 2018-08-29 NOTE — Assessment & Plan Note (Signed)
[place] Controlled Substance Database reviewed- no aberrancies noted Vyvanse 70mg  QD, also on Bupropoin XL 300mg   Increase regular exercise F/u 3 months

## 2018-09-04 ENCOUNTER — Encounter: Payer: Self-pay | Admitting: Endocrinology

## 2018-09-05 ENCOUNTER — Encounter: Payer: Self-pay | Admitting: Endocrinology

## 2018-09-05 ENCOUNTER — Ambulatory Visit (INDEPENDENT_AMBULATORY_CARE_PROVIDER_SITE_OTHER): Payer: 59 | Admitting: Endocrinology

## 2018-09-05 ENCOUNTER — Other Ambulatory Visit: Payer: Self-pay

## 2018-09-05 DIAGNOSIS — E039 Hypothyroidism, unspecified: Secondary | ICD-10-CM | POA: Diagnosis not present

## 2018-09-05 NOTE — Patient Instructions (Signed)
Please continue the same medication. Please come back for a follow-up appointment in 6 months    
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
## 2018-09-18 ENCOUNTER — Other Ambulatory Visit: Payer: Self-pay | Admitting: Adult Health

## 2018-09-18 NOTE — Telephone Encounter (Signed)
Medication last written 08/22/2018 for # 30 no refills.  LOV 08/29/2018  Please review and advise. MPulliam, CMA/RT(R)

## 2018-09-19 ENCOUNTER — Other Ambulatory Visit: Payer: Self-pay | Admitting: Adult Health

## 2018-09-19 ENCOUNTER — Telehealth: Payer: Self-pay | Admitting: Adult Health

## 2018-09-19 MED ORDER — VYVANSE 70 MG PO CAPS: 70.0000 mg | ORAL_CAPSULE | Freq: Every day | ORAL | 0 refills | Status: DC

## 2018-09-20 ENCOUNTER — Other Ambulatory Visit: Payer: Self-pay | Admitting: Adult Health

## 2018-09-21 ENCOUNTER — Other Ambulatory Visit: Payer: Self-pay | Admitting: Adult Health

## 2018-09-21 NOTE — Telephone Encounter (Signed)
Should pt come in for follow up testing, continue this medication or switch to OTC vitamin D?  Please advise.  Tiajuana Amass, CMA

## 2018-09-21 NOTE — Telephone Encounter (Signed)
Good Morning Laurie Arnold, Her last Vit D level was 33.1 on 06/05/2018, just barely in normal range Will refill just one more 16 week course, then she can change over to OTC Please call her and let her know course of treatment Thanks! Orpha Bur

## 2018-09-21 NOTE — Telephone Encounter (Signed)
Pt informed.  Pt expressed understanding and is agreeable.  T. Katty Fretwell, CMA  

## 2018-10-16 ENCOUNTER — Other Ambulatory Visit: Payer: Self-pay | Admitting: Adult Health

## 2018-10-18 ENCOUNTER — Other Ambulatory Visit: Payer: Self-pay | Admitting: Adult Health

## 2018-10-18 MED ORDER — VYVANSE 70 MG PO CAPS: 70.0000 mg | ORAL_CAPSULE | Freq: Every day | ORAL | 0 refills | Status: DC

## 2018-10-18 NOTE — Telephone Encounter (Signed)
Medication last filled 09/19/2018 for # 30 no refills.  LOV 08/29/2018 and patient has 3 month f/u appt on 11/28/2018. MPulliam, CMA/RT(R)

## 2018-11-13 ENCOUNTER — Other Ambulatory Visit: Payer: Self-pay | Admitting: Adult Health

## 2018-11-16 ENCOUNTER — Encounter: Payer: Self-pay | Admitting: Endocrinology

## 2018-11-16 ENCOUNTER — Other Ambulatory Visit: Payer: Self-pay | Admitting: Endocrinology

## 2018-11-16 ENCOUNTER — Other Ambulatory Visit: Payer: Self-pay | Admitting: Adult Health

## 2018-11-16 MED ORDER — LEVOTHYROXINE SODIUM 88 MCG PO TABS: 88.0000 ug | ORAL_TABLET | Freq: Every day | ORAL | 3 refills | Status: DC

## 2018-11-16 NOTE — Telephone Encounter (Signed)
Please review and advise.

## 2018-11-17 ENCOUNTER — Other Ambulatory Visit: Payer: Self-pay | Admitting: Adult Health

## 2018-11-17 DIAGNOSIS — Z20828 Contact with and (suspected) exposure to other viral communicable diseases: Secondary | ICD-10-CM | POA: Diagnosis not present

## 2018-11-17 DIAGNOSIS — J069 Acute upper respiratory infection, unspecified: Secondary | ICD-10-CM | POA: Diagnosis not present

## 2018-11-20 MED ORDER — VYVANSE 70 MG PO CAPS: 70.0000 mg | ORAL_CAPSULE | Freq: Every day | ORAL | 0 refills | Status: DC
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
## 2018-11-28 ENCOUNTER — Encounter: Payer: Self-pay | Admitting: Adult Health

## 2018-11-28 ENCOUNTER — Other Ambulatory Visit: Payer: Self-pay

## 2018-11-28 ENCOUNTER — Ambulatory Visit (INDEPENDENT_AMBULATORY_CARE_PROVIDER_SITE_OTHER): Payer: 59 | Admitting: Adult Health

## 2018-11-28 DIAGNOSIS — Z0000 Encounter for general adult medical examination without abnormal findings: Secondary | ICD-10-CM

## 2018-11-28 DIAGNOSIS — F32A Depression, unspecified: Secondary | ICD-10-CM

## 2018-11-28 DIAGNOSIS — R69 Illness, unspecified: Secondary | ICD-10-CM | POA: Diagnosis not present

## 2018-11-28 DIAGNOSIS — F329 Major depressive disorder, single episode, unspecified: Secondary | ICD-10-CM | POA: Diagnosis not present

## 2018-11-28 NOTE — Assessment & Plan Note (Signed)
She reports stable mood, denies SI/HI Continue Bupropion XL 300mg  QD Buspar 10mg  TID

## 2018-11-28 NOTE — Assessment & Plan Note (Signed)
Assessment and Plan: Continue all medications as directed. Increase water intake, strive for at least 100 ounces/day.   Follow Heart Healthy diet Increase regular exercise.  Recommend at least 30 minutes daily, 5 days per week of walking, jogging, biking, swimming, YouTube/Pinterest workout videos. [place] Controlled Substance Database reviewed- no aberrancies noted Continue f/u with Endocrinology as directed  Follow Up Instructions: 3 months OV
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
## 2018-12-10 ENCOUNTER — Other Ambulatory Visit: Payer: Self-pay | Admitting: Adult Health

## 2018-12-11 ENCOUNTER — Other Ambulatory Visit: Payer: Self-pay | Admitting: Adult Health

## 2018-12-14 ENCOUNTER — Other Ambulatory Visit: Payer: Self-pay | Admitting: Adult Health

## 2018-12-14 MED ORDER — VYVANSE 70 MG PO CAPS: 70.0000 mg | ORAL_CAPSULE | Freq: Every day | ORAL | 0 refills | Status: DC

## 2018-12-14 NOTE — Telephone Encounter (Signed)
Mina Marble, NP patient -  LOV 11/28/2018 and patient has 3 month f/u appt set for 02/27/2019.  Medication last filled 11/20/2018 for # 30 no refills.  Please review and advise. MPulliam, CMA/RT(R)

## 2018-12-18 ENCOUNTER — Telehealth: Payer: Self-pay | Admitting: Adult Health

## 2018-12-18 NOTE — Telephone Encounter (Signed)
Medication was last filled on 11/20/2018 and patient had to start that day as she was already out from pervious month.  This would have put her 30 days of meds ending on 12/19/2018.  Patient states that she needs to take the medication in the AM on 12/20/18 but RX can not be filled until the 12/20/2018 as that is the start date on medication.  Please review and advise if medication can be rewritten to star on 12/19/18 so patient can pick and have for her morning dose on 12/20/2018. Tried to explain to the patient that the start date for a controled substance has to be exactly 30 days, she insisted that I ask as she has had issues in the past when missing her dose of medication in the morning.  MPulliam, CMA/RT(R)

## 2018-12-18 NOTE — Telephone Encounter (Signed)
Patient called states she is out of Rx :  & request a refill VYVANSE 70 MG capsule [[id number]]   Order Details Dose: 70 mg Route: Oral Frequency: Daily  Indications of Use: Attention Deficit Hyperactivity Disorder  Dispense Quantity: 30 capsule Refills: 0 Fills remaining: --        Sig: Take 1 capsule (70 mg total) by mouth daily.     ----Forwarding request to medical assistant for review & to contact patient . [phone].  --glh

## 2018-12-19 NOTE — Telephone Encounter (Signed)
Called and left message for patient to notify. MPulliam, CMA/RT(R)

## 2018-12-19 NOTE — Telephone Encounter (Signed)
It is my understanding she already has a full prescription to be picked up.  Please have patient pick the medicine up on 8/5 in the a.m. and take it then.    I do not recommend she forego taking it in the morning of 8 5 but rather pick up the prescription and take the medicines that day

## 2019-01-06 ENCOUNTER — Other Ambulatory Visit: Payer: Self-pay | Admitting: Adult Health

## 2019-01-08 ENCOUNTER — Telehealth: Payer: Self-pay

## 2019-01-08 NOTE — Telephone Encounter (Signed)
Please call pt to schedule lab appt.  Needs Vitamin D level prior to any further refills.  Charyl Bigger, CMA

## 2019-01-14 ENCOUNTER — Other Ambulatory Visit: Payer: Self-pay | Admitting: Family Medicine

## 2019-01-17 ENCOUNTER — Other Ambulatory Visit: Payer: Self-pay | Admitting: Family Medicine

## 2019-01-18 MED ORDER — VYVANSE 70 MG PO CAPS: 70.0000 mg | ORAL_CAPSULE | Freq: Every day | ORAL | 0 refills | Status: DC

## 2019-01-29 ENCOUNTER — Telehealth: Payer: Self-pay | Admitting: Adult Health

## 2019-01-29 NOTE — Telephone Encounter (Signed)
Per Patient she & Valetta Fuller discussed @ last appt that she was satisfied w/ levels & pt no longer needed--Pt declined to make appt ---forwarding message to medical assistant for review w/provider.  --glh

## 2019-02-14 ENCOUNTER — Other Ambulatory Visit: Payer: Self-pay | Admitting: Adult Health

## 2019-02-14 NOTE — Telephone Encounter (Signed)
Pt has f/u appt 02/27/2019.  Charyl Bigger, CMA

## 2019-02-16 ENCOUNTER — Other Ambulatory Visit: Payer: Self-pay | Admitting: Adult Health

## 2019-02-20 ENCOUNTER — Other Ambulatory Visit: Payer: Self-pay | Admitting: Adult Health

## 2019-02-20 MED ORDER — VYVANSE 70 MG PO CAPS: 70.0000 mg | ORAL_CAPSULE | Freq: Every day | ORAL | 0 refills | Status: DC

## 2019-02-21 ENCOUNTER — Other Ambulatory Visit: Payer: Self-pay

## 2019-02-21 ENCOUNTER — Encounter: Payer: Self-pay | Admitting: Adult Health

## 2019-02-21 ENCOUNTER — Ambulatory Visit (INDEPENDENT_AMBULATORY_CARE_PROVIDER_SITE_OTHER): Payer: 59 | Admitting: Adult Health

## 2019-02-21 ENCOUNTER — Telehealth: Payer: Self-pay

## 2019-02-21 DIAGNOSIS — R059 Cough, unspecified: Secondary | ICD-10-CM | POA: Insufficient documentation

## 2019-02-21 DIAGNOSIS — R05 Cough: Secondary | ICD-10-CM | POA: Diagnosis not present

## 2019-02-21 DIAGNOSIS — Z20822 Contact with and (suspected) exposure to covid-19: Secondary | ICD-10-CM

## 2019-02-21 DIAGNOSIS — Z20828 Contact with and (suspected) exposure to other viral communicable diseases: Secondary | ICD-10-CM | POA: Diagnosis not present
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
## 2019-02-21 NOTE — Assessment & Plan Note (Signed)
Assessment and Plan: Increase fluids, rest. Alternate OTC Acetaminophen/Ibuprofen for discomfort. SARS-CoV-2 testing- discussed quarantine protocol. If sx's persist for another 9 days- send MyChart message and will then send in ABX. Dicussed Red Flag sx' s when to seek immediate medical assistance- she communicated understanding/agreement.  Follow Up Instructions: PRN   I discussed the assessment and treatment plan with the patient. The patient was provided an opportunity to ask questions and all were answered. The patient agreed with the plan and demonstrated an understanding of the instructions.   The patient was advised to call back or seek an in-person evaluation if the symptoms worsen or if the condition fails to improve as anticipated.

## 2019-02-21 NOTE — Telephone Encounter (Signed)
Patient called complaining of chest tightness and burning in the center of her chest.  Also has a dry cough. Temp 97.6, o2 97, p59.  Patient is going to be evaluated by Mina Marble, NP.  Order placed for covid.  Encouraged patient to have test.

## 2019-02-23 LAB — NOVEL CORONAVIRUS, NAA: SARS-CoV-2, NAA: NOT DETECTED
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
## 2019-02-27 ENCOUNTER — Other Ambulatory Visit: Payer: Self-pay

## 2019-02-27 ENCOUNTER — Encounter: Payer: Self-pay | Admitting: Adult Health

## 2019-02-27 ENCOUNTER — Ambulatory Visit (INDEPENDENT_AMBULATORY_CARE_PROVIDER_SITE_OTHER): Payer: 59 | Admitting: Adult Health

## 2019-02-27 DIAGNOSIS — L0291 Cutaneous abscess, unspecified: Secondary | ICD-10-CM | POA: Diagnosis not present

## 2019-02-27 DIAGNOSIS — Z0000 Encounter for general adult medical examination without abnormal findings: Secondary | ICD-10-CM | POA: Diagnosis not present

## 2019-02-27 DIAGNOSIS — R350 Frequency of micturition: Secondary | ICD-10-CM | POA: Diagnosis not present

## 2019-02-27 DIAGNOSIS — E7800 Pure hypercholesterolemia, unspecified: Secondary | ICD-10-CM | POA: Diagnosis not present

## 2019-02-27 MED ORDER — CLINDAMYCIN HCL 300 MG PO CAPS: 300.0000 mg | ORAL_CAPSULE | Freq: Three times a day (TID) | ORAL | 0 refills | Status: DC

## 2019-02-27 NOTE — Assessment & Plan Note (Signed)
Fasting labs ordered

## 2019-02-27 NOTE — Assessment & Plan Note (Signed)
She reports urinary frequency, urinary dribbling, urgency, and dysuria- sx's began 4 days. She denies fever/N/V/D. She reports urinary leakage for the last year, she has been using a paper towel to absorb urine- recommend using Poise Pads. Offered referral to Urology, she declined referral.

## 2019-02-27 NOTE — Assessment & Plan Note (Signed)
Assessment and Plan: Continue all medications as directed. Increase water intake, strive for at least 100 ounces/day.   Follow Heart Healthy diet Increase regular exercise.  Recommend at least 30 minutes daily, 5 days per week of walking, biking, swimming, YouTube/Pinterest workout videos.  Follow Up Instructions: CPE in 3 months Fasting labs with UA (due to acute urinary sx's) this week   I discussed the assessment and treatment plan with the patient. The patient was provided an opportunity to ask questions and all were answered. The patient agreed with the plan and demonstrated an understanding of the instructions.   The patient was advised to call back or seek an in-person evaluation if the symptoms worsen or if the condition fails to improve as anticipated.

## 2019-02-28 ENCOUNTER — Encounter: Payer: Self-pay | Admitting: Adult Health

## 2019-02-28 ENCOUNTER — Other Ambulatory Visit (INDEPENDENT_AMBULATORY_CARE_PROVIDER_SITE_OTHER): Payer: 59

## 2019-02-28 ENCOUNTER — Other Ambulatory Visit: Payer: Self-pay | Admitting: Adult Health

## 2019-02-28 DIAGNOSIS — E039 Hypothyroidism, unspecified: Secondary | ICD-10-CM | POA: Diagnosis not present

## 2019-02-28 DIAGNOSIS — E7800 Pure hypercholesterolemia, unspecified: Secondary | ICD-10-CM | POA: Diagnosis not present

## 2019-02-28 DIAGNOSIS — R8290 Unspecified abnormal findings in urine: Secondary | ICD-10-CM

## 2019-02-28 DIAGNOSIS — R7989 Other specified abnormal findings of blood chemistry: Secondary | ICD-10-CM | POA: Diagnosis not present

## 2019-02-28 DIAGNOSIS — Z0000 Encounter for general adult medical examination without abnormal findings: Secondary | ICD-10-CM

## 2019-02-28 DIAGNOSIS — R300 Dysuria: Secondary | ICD-10-CM

## 2019-02-28 DIAGNOSIS — R5383 Other fatigue: Secondary | ICD-10-CM

## 2019-02-28 LAB — POCT URINALYSIS DIPSTICK
Bilirubin, UA: NEGATIVE
Blood, UA: NEGATIVE
Glucose, UA: NEGATIVE
Ketones, UA: NEGATIVE
Leukocytes, UA: NEGATIVE
Nitrite, UA: POSITIVE
Protein, UA: NEGATIVE
Spec Grav, UA: 1.025 (ref 1.010–1.025)
Urobilinogen, UA: 0.2 E.U./dL
pH, UA: 6.5 (ref 5.0–8.0)

## 2019-02-28 MED ORDER — PHENAZOPYRIDINE HCL 97.2 MG PO TABS: 97.0000 mg | ORAL_TABLET | Freq: Three times a day (TID) | ORAL | 0 refills | Status: DC | PRN

## 2019-03-01 LAB — COMPREHENSIVE METABOLIC PANEL
ALT: 22 IU/L (ref 0–32)
AST: 30 IU/L (ref 0–40)
Albumin/Globulin Ratio: 1.4 (ref 1.2–2.2)
Albumin: 4.5 g/dL (ref 3.8–4.9)
Alkaline Phosphatase: 108 IU/L (ref 39–117)
BUN/Creatinine Ratio: 16 (ref 9–23)
BUN: 13 mg/dL (ref 6–24)
Bilirubin Total: 0.5 mg/dL (ref 0.0–1.2)
CO2: 23 mmol/L (ref 20–29)
Calcium: 9.6 mg/dL (ref 8.7–10.2)
Chloride: 105 mmol/L (ref 96–106)
Creatinine, Ser: 0.83 mg/dL (ref 0.57–1.00)
GFR calc Af Amer: 94 mL/min/{1.73_m2} (ref >59)
GFR calc non Af Amer: 82 mL/min/{1.73_m2} (ref >59)
Globulin, Total: 3.2 g/dL (ref 1.5–4.5)
Glucose: 102 mg/dL — ABNORMAL HIGH (ref 65–99)
Potassium: 3.8 mmol/L (ref 3.5–5.2)
Sodium: 143 mmol/L (ref 134–144)
Total Protein: 7.7 g/dL (ref 6.0–8.5)

## 2019-03-01 LAB — LIPID PANEL
Chol/HDL Ratio: 4.4 ratio (ref 0.0–4.4)
Cholesterol, Total: 259 mg/dL — ABNORMAL HIGH (ref 100–199)
HDL: 59 mg/dL (ref >39)
LDL Chol Calc (NIH): 172 mg/dL — ABNORMAL HIGH (ref 0–99)
Triglycerides: 153 mg/dL — ABNORMAL HIGH (ref 0–149)
VLDL Cholesterol Cal: 28 mg/dL (ref 5–40)

## 2019-03-01 LAB — CBC WITH DIFFERENTIAL/PLATELET
Basophils Absolute: 0.1 10*3/uL (ref 0.0–0.2)
Basos: 1 %
EOS (ABSOLUTE): 0.3 10*3/uL (ref 0.0–0.4)
Eos: 4 %
Hematocrit: 45.4 % (ref 34.0–46.6)
Hemoglobin: 15.6 g/dL (ref 11.1–15.9)
Immature Grans (Abs): 0 10*3/uL (ref 0.0–0.1)
Immature Granulocytes: 0 %
Lymphocytes Absolute: 1.8 10*3/uL (ref 0.7–3.1)
Lymphs: 25 %
MCH: 31.1 pg (ref 26.6–33.0)
MCHC: 34.4 g/dL (ref 31.5–35.7)
MCV: 90 fL (ref 79–97)
Monocytes Absolute: 0.8 10*3/uL (ref 0.1–0.9)
Monocytes: 11 %
Neutrophils Absolute: 4.1 10*3/uL (ref 1.4–7.0)
Neutrophils: 59 %
Platelets: 352 10*3/uL (ref 150–450)
RBC: 5.02 x10E6/uL (ref 3.77–5.28)
RDW: 13.1 % (ref 11.7–15.4)
WBC: 7.1 10*3/uL (ref 3.4–10.8)

## 2019-03-01 LAB — TSH: TSH: 1.42 u[IU]/mL (ref 0.450–4.500)

## 2019-03-01 LAB — HEMOGLOBIN A1C
Est. average glucose Bld gHb Est-mCnc: 114 mg/dL
Hgb A1c MFr Bld: 5.6 % (ref 4.8–5.6)

## 2019-03-02 ENCOUNTER — Encounter: Payer: Self-pay | Admitting: Adult Health

## 2019-03-03 LAB — CULTURE, URINE COMPREHENSIVE

## 2019-03-04 ENCOUNTER — Encounter: Payer: Self-pay | Admitting: Adult Health

## 2019-03-04 ENCOUNTER — Other Ambulatory Visit: Payer: Self-pay | Admitting: Adult Health

## 2019-03-04 MED ORDER — NITROFURANTOIN MONOHYD MACRO 100 MG PO CAPS: 100.0000 mg | ORAL_CAPSULE | Freq: Two times a day (BID) | ORAL | 0 refills | Status: DC

## 2019-03-05 ENCOUNTER — Encounter: Payer: Self-pay | Admitting: Endocrinology

## 2019-03-06 ENCOUNTER — Ambulatory Visit (INDEPENDENT_AMBULATORY_CARE_PROVIDER_SITE_OTHER): Payer: 59 | Admitting: Endocrinology

## 2019-03-06 ENCOUNTER — Encounter: Payer: Self-pay | Admitting: Endocrinology

## 2019-03-06 ENCOUNTER — Other Ambulatory Visit: Payer: Self-pay

## 2019-03-06 DIAGNOSIS — E039 Hypothyroidism, unspecified: Secondary | ICD-10-CM

## 2019-03-06 NOTE — Telephone Encounter (Signed)
Please advise 
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
## 2019-03-06 NOTE — Patient Instructions (Signed)
Please continue the same medication. Please come back for a follow-up appointment in 6 months    

## 2019-03-06 NOTE — Telephone Encounter (Signed)
Please call pt to convert from in person to VV

## 2019-03-12 ENCOUNTER — Other Ambulatory Visit: Payer: Self-pay | Admitting: Adult Health

## 2019-03-12 ENCOUNTER — Other Ambulatory Visit: Payer: Self-pay

## 2019-03-16 ENCOUNTER — Encounter: Payer: Self-pay | Admitting: Adult Health

## 2019-03-22 ENCOUNTER — Other Ambulatory Visit: Payer: Self-pay | Admitting: Adult Health

## 2019-03-22 MED ORDER — VYVANSE 70 MG PO CAPS: 70.0000 mg | ORAL_CAPSULE | Freq: Every day | ORAL | 0 refills | Status: DC

## 2019-03-23 MED ORDER — BUPROPION HCL ER (XL) 300 MG PO TB24: 300.0000 mg | ORAL_TABLET | Freq: Every day | ORAL | 0 refills | Status: DC

## 2019-03-23 MED ORDER — BUSPIRONE HCL 10 MG PO TABS: 10.0000 mg | ORAL_TABLET | Freq: Three times a day (TID) | ORAL | 0 refills | Status: DC

## 2019-04-02 ENCOUNTER — Telehealth: Payer: Self-pay | Admitting: Adult Health

## 2019-04-02 NOTE — Telephone Encounter (Signed)
Patient called states she wanted refills for ( 90dys) not just 30dys on these two prescriptions :   buPROPion (WELLBUTRIN XL) 300 MG 24 hr tablet  - busPIRone (BUSPAR) 10 MG tablet  If you have any questions about your prescription, please send a message to your doctor.  --Forwarding message to med asst to see if any way she can send next refill due in 30dys as a ( 90day order) to  :  [id number] [address], [place] (Phone) [phone] (Fax)   --glh

## 2019-04-03 ENCOUNTER — Other Ambulatory Visit: Payer: Self-pay | Admitting: Adult Health

## 2019-04-03 MED ORDER — BUPROPION HCL ER (XL) 300 MG PO TB24: 300.0000 mg | ORAL_TABLET | Freq: Every day | ORAL | 0 refills | Status: DC

## 2019-04-03 NOTE — Telephone Encounter (Signed)
90 day supply sent  Thanks! Valetta Fuller

## 2019-04-19 ENCOUNTER — Other Ambulatory Visit: Payer: Self-pay | Admitting: Adult Health

## 2019-04-19 MED ORDER — VYVANSE 70 MG PO CAPS: 70.0000 mg | ORAL_CAPSULE | Freq: Every day | ORAL | 0 refills | Status: DC

## 2019-05-17 ENCOUNTER — Other Ambulatory Visit: Payer: Self-pay | Admitting: Adult Health

## 2019-05-17 MED ORDER — VYVANSE 70 MG PO CAPS: 70.0000 mg | ORAL_CAPSULE | Freq: Every day | ORAL | 0 refills | Status: DC

## 2019-05-18 ENCOUNTER — Other Ambulatory Visit: Payer: Self-pay | Admitting: Adult Health

## 2019-05-19 ENCOUNTER — Other Ambulatory Visit: Payer: Self-pay | Admitting: Adult Health

## 2019-05-21 MED ORDER — BUSPIRONE HCL 10 MG PO TABS: 10.0000 mg | ORAL_TABLET | Freq: Three times a day (TID) | ORAL | 0 refills | Status: DC

## 2019-06-14 ENCOUNTER — Other Ambulatory Visit: Payer: Self-pay | Admitting: Family Medicine

## 2019-06-14 NOTE — Telephone Encounter (Signed)
This is not a controlled substance.  Please refill per our protocols if appropriate.  If patient is overdue for appointment, please schedule her

## 2019-06-15 ENCOUNTER — Telehealth: Payer: Self-pay

## 2019-06-15 NOTE — Telephone Encounter (Signed)
Please call pt to schedule appt for CPE.  Pt given a 15 day supply of meds today.  No further refills until pt is seen!  Tiajuana Amass, CMA

## 2019-06-17 ENCOUNTER — Encounter: Payer: Self-pay | Admitting: Adult Health

## 2019-06-17 ENCOUNTER — Other Ambulatory Visit: Payer: Self-pay | Admitting: Family Medicine

## 2019-06-17 ENCOUNTER — Other Ambulatory Visit: Payer: Self-pay | Admitting: Adult Health

## 2019-06-18 MED ORDER — VYVANSE 70 MG PO CAPS: 70.0000 mg | ORAL_CAPSULE | Freq: Every day | ORAL | 0 refills | Status: DC

## 2019-06-19 ENCOUNTER — Other Ambulatory Visit: Payer: Self-pay | Admitting: Adult Health

## 2019-06-19 MED ORDER — BUPROPION HCL ER (XL) 300 MG PO TB24: 300.0000 mg | ORAL_TABLET | Freq: Every day | ORAL | 0 refills | Status: DC

## 2019-06-22 ENCOUNTER — Other Ambulatory Visit: Payer: 59
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
## 2019-06-27 ENCOUNTER — Other Ambulatory Visit: Payer: Self-pay

## 2019-06-27 ENCOUNTER — Encounter: Payer: 59 | Admitting: Adult Health

## 2019-07-16 ENCOUNTER — Other Ambulatory Visit: Payer: Self-pay | Admitting: Adult Health

## 2019-07-16 ENCOUNTER — Other Ambulatory Visit: Payer: Self-pay | Admitting: Family Medicine

## 2019-07-17 MED ORDER — BUPROPION HCL ER (XL) 300 MG PO TB24: 300.0000 mg | ORAL_TABLET | Freq: Every day | ORAL | 0 refills | Status: DC

## 2019-07-17 NOTE — Telephone Encounter (Signed)
Patient was last seen 02/27/2019 and advised to follow up in 3 months.   Please advise. AS, CMA

## 2019-07-17 NOTE — Telephone Encounter (Signed)
Upon review of the chart, patient was sent a message around June 18, 2019 and it said that she could not have further refills until she was seen as patient has apparently history of noncompliance. Thus, no refills until patient is seen for follow-up as instructed many times in the past.  Thank you.

## 2019-07-17 NOTE — Telephone Encounter (Signed)
I am unable to refuse this medication because of drug class. AS, CMA

## 2019-07-18 ENCOUNTER — Other Ambulatory Visit: Payer: Self-pay | Admitting: Family Medicine

## 2019-07-18 ENCOUNTER — Other Ambulatory Visit: Payer: Self-pay | Admitting: Adult Health

## 2019-07-18 NOTE — Telephone Encounter (Signed)
This is a duplicate. Patient needs an apt. AS, CMA

## 2019-07-20 ENCOUNTER — Encounter: Payer: Self-pay | Admitting: Family Medicine

## 2019-07-20 ENCOUNTER — Other Ambulatory Visit: Payer: Self-pay

## 2019-07-20 ENCOUNTER — Ambulatory Visit (INDEPENDENT_AMBULATORY_CARE_PROVIDER_SITE_OTHER): Payer: 59 | Admitting: Family Medicine

## 2019-07-20 VITALS — BP 136/85 | HR 68 | Temp 97.9°F | Resp 12 | Ht 64.0 in | Wt 201.5 lb

## 2019-07-20 DIAGNOSIS — Z719 Counseling, unspecified: Secondary | ICD-10-CM | POA: Diagnosis not present

## 2019-07-20 DIAGNOSIS — Z1211 Encounter for screening for malignant neoplasm of colon: Secondary | ICD-10-CM

## 2019-07-20 DIAGNOSIS — E669 Obesity, unspecified: Secondary | ICD-10-CM | POA: Insufficient documentation

## 2019-07-20 DIAGNOSIS — Z1231 Encounter for screening mammogram for malignant neoplasm of breast: Secondary | ICD-10-CM | POA: Diagnosis not present

## 2019-07-20 DIAGNOSIS — Z0000 Encounter for general adult medical examination without abnormal findings: Secondary | ICD-10-CM

## 2019-07-20 MED ORDER — BUSPIRONE HCL 10 MG PO TABS: 10.0000 mg | ORAL_TABLET | Freq: Three times a day (TID) | ORAL | 2 refills | Status: DC

## 2019-07-20 MED ORDER — BUPROPION HCL ER (XL) 300 MG PO TB24: 300.0000 mg | ORAL_TABLET | Freq: Every day | ORAL | 2 refills | Status: DC

## 2019-07-20 MED ORDER — VYVANSE 70 MG PO CAPS: 70.0000 mg | ORAL_CAPSULE | Freq: Every day | ORAL | 0 refills | Status: DC

## 2019-07-20 NOTE — Patient Instructions (Signed)
Preventive Care for Adults, Female  A healthy lifestyle and preventive care can promote health and wellness. Preventive health guidelines for women include the following key practices.   A routine yearly physical is a good way to check with your health care provider about your health and preventive screening. It is a chance to share any concerns and updates on your health and to receive a thorough exam.   Visit your dentist for a routine exam and preventive care every 6 months. Brush your teeth twice a day and floss once a day. Good oral hygiene prevents tooth decay and gum disease.   The frequency of eye exams is based on your age, health, family medical history, use of contact lenses, and other factors. Follow your health care provider's recommendations for frequency of eye exams.   Eat a healthy diet. Foods like vegetables, fruits, whole grains, low-fat dairy products, and lean protein foods contain the nutrients you need without too many calories. Decrease your intake of foods high in solid fats, added sugars, and salt. Eat the right amount of calories for you.Get information about a proper diet from your health care provider, if necessary.   Regular physical exercise is one of the most important things you can do for your health. Most adults should get at least 150 minutes of moderate-intensity exercise (any activity that increases your heart rate and causes you to sweat) each week. In addition, most adults need muscle-strengthening exercises on 2 or more days a week.   Maintain a healthy weight. The body mass index (BMI) is a screening tool to identify possible weight problems. It provides an estimate of body fat based on height and weight. Your health care provider can find your BMI, and can help you achieve or maintain a healthy weight.For adults 20 years and older:   - A BMI below 18.5 is considered underweight.   - A BMI of 18.5 to 24.9 is normal.   - A BMI of 25 to 29.9 is  considered overweight.   - A BMI of 30 and above is considered obese.   Maintain normal blood lipids and cholesterol levels by exercising and minimizing your intake of trans and saturated fats.  Eat a balanced diet with plenty of fruit and vegetables. Blood tests for lipids and cholesterol should begin at age 20 and be repeated every 5 years minimum.  If your lipid or cholesterol levels are high, you are over 40, or you are at high risk for heart disease, you may need your cholesterol levels checked more frequently.Ongoing high lipid and cholesterol levels should be treated with medicines if diet and exercise are not working.   If you smoke, find out from your health care provider how to quit. If you do not use tobacco, do not start.   Lung cancer screening is recommended for adults aged 55-80 years who are at high risk for developing lung cancer because of a history of smoking. A yearly low-dose CT scan of the lungs is recommended for people who have at least a 30-pack-year history of smoking and are a current smoker or have quit within the past 15 years. A pack year of smoking is smoking an average of 1 pack of cigarettes a day for 1 year (for example: 1 pack a day for 30 years or 2 packs a day for 15 years). Yearly screening should continue until the smoker has stopped smoking for at least 15 years. Yearly screening should be stopped for people who develop a   health problem that would prevent them from having lung cancer treatment.   If you are pregnant, do not drink alcohol. If you are breastfeeding, be very cautious about drinking alcohol. If you are not pregnant and choose to drink alcohol, do not have more than 1 drink per day. One drink is considered to be 12 ounces (355 mL) of beer, 5 ounces (148 mL) of wine, or 1.5 ounces (44 mL) of liquor.   Avoid use of street drugs. Do not share needles with anyone. Ask for help if you need support or instructions about stopping the use of  drugs.   High blood pressure causes heart disease and increases the risk of stroke. Your blood pressure should be checked at least yearly.  Ongoing high blood pressure should be treated with medicines if weight loss and exercise do not work.   If you are 69-55 years old, ask your health care provider if you should take aspirin to prevent strokes.   Diabetes screening involves taking a blood sample to check your fasting blood sugar level. This should be done once every 3 years, after age 38, if you are within normal weight and without risk factors for diabetes. Testing should be considered at a younger age or be carried out more frequently if you are overweight and have at least 1 risk factor for diabetes.   Breast cancer screening is essential preventive care for women. You should practice "breast self-awareness."  This means understanding the normal appearance and feel of your breasts and may include breast self-examination.  Any changes detected, no matter how small, should be reported to a health care provider.  Women in their 80s and 30s should have a clinical breast exam (CBE) by a health care provider as part of a regular health exam every 1 to 3 years.  After age 66, women should have a CBE every year.  Starting at age 1, women should consider having a mammogram (breast X-ray test) every year.  Women who have a family history of breast cancer should talk to their health care provider about genetic screening.  Women at a high risk of breast cancer should talk to their health care providers about having an MRI and a mammogram every year.   -Breast cancer gene (BRCA)-related cancer risk assessment is recommended for women who have family members with BRCA-related cancers. BRCA-related cancers include breast, ovarian, tubal, and peritoneal cancers. Having family members with these cancers may be associated with an increased risk for harmful changes (mutations) in the breast cancer genes BRCA1 and  BRCA2. Results of the assessment will determine the need for genetic counseling and BRCA1 and BRCA2 testing.   The Pap test is a screening test for cervical cancer. A Pap test can show cell changes on the cervix that might become cervical cancer if left untreated. A Pap test is a procedure in which cells are obtained and examined from the lower end of the uterus (cervix).   - Women should have a Pap test starting at age 57.   - Between ages 90 and 70, Pap tests should be repeated every 2 years.   - Beginning at age 63, you should have a Pap test every 3 years as long as the past 3 Pap tests have been normal.   - Some women have medical problems that increase the chance of getting cervical cancer. Talk to your health care provider about these problems. It is especially important to talk to your health care provider if a  new problem develops soon after your last Pap test. In these cases, your health care provider may recommend more frequent screening and Pap tests.   - The above recommendations are the same for women who have or have not gotten the vaccine for human papillomavirus (HPV).   - If you had a hysterectomy for a problem that was not cancer or a condition that could lead to cancer, then you no longer need Pap tests. Even if you no longer need a Pap test, a regular exam is a good idea to make sure no other problems are starting.   - If you are between ages 36 and 66 years, and you have had normal Pap tests going back 10 years, you no longer need Pap tests. Even if you no longer need a Pap test, a regular exam is a good idea to make sure no other problems are starting.   - If you have had past treatment for cervical cancer or a condition that could lead to cancer, you need Pap tests and screening for cancer for at least 20 years after your treatment.   - If Pap tests have been discontinued, risk factors (such as a new sexual partner) need to be reassessed to determine if screening should  be resumed.   - The HPV test is an additional test that may be used for cervical cancer screening. The HPV test looks for the virus that can cause the cell changes on the cervix. The cells collected during the Pap test can be tested for HPV. The HPV test could be used to screen women aged 70 years and older, and should be used in women of any age who have unclear Pap test results. After the age of 67, women should have HPV testing at the same frequency as a Pap test.   Colorectal cancer can be detected and often prevented. Most routine colorectal cancer screening begins at the age of 57 years and continues through age 26 years. However, your health care provider may recommend screening at an earlier age if you have risk factors for colon cancer. On a yearly basis, your health care provider may provide home test kits to check for hidden blood in the stool.  Use of a small camera at the end of a tube, to directly examine the colon (sigmoidoscopy or colonoscopy), can detect the earliest forms of colorectal cancer. Talk to your health care provider about this at age 23, when routine screening begins. Direct exam of the colon should be repeated every 5 -10 years through age 49 years, unless early forms of pre-cancerous polyps or small growths are found.   People who are at an increased risk for hepatitis B should be screened for this virus. You are considered at high risk for hepatitis B if:  -You were born in a country where hepatitis B occurs often. Talk with your health care provider about which countries are considered high risk.  - Your parents were born in a high-risk country and you have not received a shot to protect against hepatitis B (hepatitis B vaccine).  - You have HIV or AIDS.  - You use needles to inject street drugs.  - You live with, or have sex with, someone who has Hepatitis B.  - You get hemodialysis treatment.  - You take certain medicines for conditions like cancer, organ  transplantation, and autoimmune conditions.   Hepatitis C blood testing is recommended for all people born from [birth year] through 1965 and any individual  with known risks for hepatitis C.   Practice safe sex. Use condoms and avoid high-risk sexual practices to reduce the spread of sexually transmitted infections (STIs). STIs include gonorrhea, chlamydia, syphilis, trichomonas, herpes, HPV, and human immunodeficiency virus (HIV). Herpes, HIV, and HPV are viral illnesses that have no cure. They can result in disability, cancer, and death. Sexually active women aged 25 years and younger should be checked for chlamydia. Older women with new or multiple partners should also be tested for chlamydia. Testing for other STIs is recommended if you are sexually active and at increased risk.   Osteoporosis is a disease in which the bones lose minerals and strength with aging. This can result in serious bone fractures or breaks. The risk of osteoporosis can be identified using a bone density scan. Women ages 65 years and over and women at risk for fractures or osteoporosis should discuss screening with their health care providers. Ask your health care provider whether you should take a calcium supplement or vitamin D to There are also several preventive steps women can take to avoid osteoporosis and resulting fractures or to keep osteoporosis from worsening. -->Recommendations include:  Eat a balanced diet high in fruits, vegetables, calcium, and vitamins.  Get enough calcium. The recommended total intake of is 1,200 mg daily; for best absorption, if taking supplements, divide doses into 250-500 mg doses throughout the day. Of the two types of calcium, calcium carbonate is best absorbed when taken with food but calcium citrate can be taken on an empty stomach.  Get enough vitamin D. NAMS and the National Osteoporosis Foundation recommend at least 1,000 IU per day for women age 50 and over who are at risk of vitamin D  deficiency. Vitamin D deficiency can be caused by inadequate sun exposure (for example, those who live in northern latitudes).  Avoid alcohol and smoking. Heavy alcohol intake (more than 7 drinks per week) increases the risk of falls and hip fracture and women smokers tend to lose bone more rapidly and have lower bone mass than nonsmokers. Stopping smoking is one of the most important changes women can make to improve their health and decrease risk for disease.  Be physically active every day. Weight-bearing exercise (for example, fast walking, hiking, jogging, and weight training) may strengthen bones or slow the rate of bone loss that comes with aging. Balancing and muscle-strengthening exercises can reduce the risk of falling and fracture.  Consider therapeutic medications. Currently, several types of effective drugs are available. Healthcare providers can recommend the type most appropriate for each woman.  Eliminate environmental factors that may contribute to accidents. Falls cause nearly 90% of all osteoporotic fractures, so reducing this risk is an important bone-health strategy. Measures include ample lighting, removing obstructions to walking, using nonskid rugs on floors, and placing mats and/or grab bars in showers.  Be aware of medication side effects. Some common medicines make bones weaker. These include a type of steroid drug called glucocorticoids used for arthritis and asthma, some antiseizure drugs, certain sleeping pills, treatments for endometriosis, and some cancer drugs. An overactive thyroid gland or using too much thyroid hormone for an underactive thyroid can also be a problem. If you are taking these medicines, talk to your doctor about what you can do to help protect your bones.reduce the rate of osteoporosis.    Menopause can be associated with physical symptoms and risks. Hormone replacement therapy is available to decrease symptoms and risks. You should talk to your  health care provider   about whether hormone replacement therapy is right for you.   Use sunscreen. Apply sunscreen liberally and repeatedly throughout the day. You should seek shade when your shadow is shorter than you. Protect yourself by wearing long sleeves, pants, a wide-brimmed hat, and sunglasses year round, whenever you are outdoors.   Once a month, do a whole body skin exam, using a mirror to look at the skin on your back. Tell your health care provider of new moles, moles that have irregular borders, moles that are larger than a pencil eraser, or moles that have changed in shape or color.   -Stay current with required vaccines (immunizations).   Influenza vaccine. All adults should be immunized every year.  Tetanus, diphtheria, and acellular pertussis (Td, Tdap) vaccine. Pregnant women should receive 1 dose of Tdap vaccine during each pregnancy. The dose should be obtained regardless of the length of time since the last dose. Immunization is preferred during the 27th 36th week of gestation. An adult who has not previously received Tdap or who does not know her vaccine status should receive 1 dose of Tdap. This initial dose should be followed by tetanus and diphtheria toxoids (Td) booster doses every 10 years. Adults with an unknown or incomplete history of completing a 3-dose immunization series with Td-containing vaccines should begin or complete a primary immunization series including a Tdap dose. Adults should receive a Td booster every 10 years.  Varicella vaccine. An adult without evidence of immunity to varicella should receive 2 doses or a second dose if she has previously received 1 dose. Pregnant females who do not have evidence of immunity should receive the first dose after pregnancy. This first dose should be obtained before leaving the health care facility. The second dose should be obtained 4 8 weeks after the first dose.  Human papillomavirus (HPV) vaccine. Females aged 13 26  years who have not received the vaccine previously should obtain the 3-dose series. The vaccine is not recommended for use in pregnant females. However, pregnancy testing is not needed before receiving a dose. If a female is found to be pregnant after receiving a dose, no treatment is needed. In that case, the remaining doses should be delayed until after the pregnancy. Immunization is recommended for any person with an immunocompromised condition through the age of 26 years if she did not get any or all doses earlier. During the 3-dose series, the second dose should be obtained 4 8 weeks after the first dose. The third dose should be obtained 24 weeks after the first dose and 16 weeks after the second dose.  Zoster vaccine. One dose is recommended for adults aged 60 years or older unless certain conditions are present.  Measles, mumps, and rubella (MMR) vaccine. Adults born before 1957 generally are considered immune to measles and mumps. Adults born in 1957 or later should have 1 or more doses of MMR vaccine unless there is a contraindication to the vaccine or there is laboratory evidence of immunity to each of the three diseases. A routine second dose of MMR vaccine should be obtained at least 28 days after the first dose for students attending postsecondary schools, health care workers, or international travelers. People who received inactivated measles vaccine or an unknown type of measles vaccine during 1963 1967 should receive 2 doses of MMR vaccine. People who received inactivated mumps vaccine or an unknown type of mumps vaccine before 1979 and are at high risk for mumps infection should consider immunization with 2 doses of   MMR vaccine. For females of childbearing age, rubella immunity should be determined. If there is no evidence of immunity, females who are not pregnant should be vaccinated. If there is no evidence of immunity, females who are pregnant should delay immunization until after pregnancy.  Unvaccinated health care workers born before [birth year] who lack laboratory evidence of measles, mumps, or rubella immunity or laboratory confirmation of disease should consider measles and mumps immunization with 2 doses of MMR vaccine or rubella immunization with 1 dose of MMR vaccine.  Pneumococcal 13-valent conjugate (PCV13) vaccine. When indicated, a person who is uncertain of her immunization history and has no record of immunization should receive the PCV13 vaccine. An adult aged 54 years or older who has certain medical conditions and has not been previously immunized should receive 1 dose of PCV13 vaccine. This PCV13 should be followed with a dose of pneumococcal polysaccharide (PPSV23) vaccine. The PPSV23 vaccine dose should be obtained at least 8 weeks after the dose of PCV13 vaccine. An adult aged 58 years or older who has certain medical conditions and previously received 1 or more doses of PPSV23 vaccine should receive 1 dose of PCV13. The PCV13 vaccine dose should be obtained 1 or more years after the last PPSV23 vaccine dose.  Pneumococcal polysaccharide (PPSV23) vaccine. When PCV13 is also indicated, PCV13 should be obtained first. All adults aged 58 years and older should be immunized. An adult younger than age 65 years who has certain medical conditions should be immunized. Any person who resides in a nursing home or long-term care facility should be immunized. An adult smoker should be immunized. People with an immunocompromised condition and certain other conditions should receive both PCV13 and PPSV23 vaccines. People with human immunodeficiency virus (HIV) infection should be immunized as soon as possible after diagnosis. Immunization during chemotherapy or radiation therapy should be avoided. Routine use of PPSV23 vaccine is not recommended for American Indians, [place] Natives, or people younger than 65 years unless there are medical conditions that require PPSV23 vaccine. When indicated,  people who have unknown immunization and have no record of immunization should receive PPSV23 vaccine. One-time revaccination 5 years after the first dose of PPSV23 is recommended for people aged 70 64 years who have chronic kidney failure, nephrotic syndrome, asplenia, or immunocompromised conditions. People who received 1 2 doses of PPSV23 before age 32 years should receive another dose of PPSV23 vaccine at age 96 years or later if at least 5 years have passed since the previous dose. Doses of PPSV23 are not needed for people immunized with PPSV23 at or after age 55 years.  Meningococcal vaccine. Adults with asplenia or persistent complement component deficiencies should receive 2 doses of quadrivalent meningococcal conjugate (MenACWY-D) vaccine. The doses should be obtained at least 2 months apart. Microbiologists working with certain meningococcal bacteria, [employer] recruits, people at risk during an outbreak, and people who travel to or live in countries with a high rate of meningitis should be immunized. A first-year college student up through age 58 years who is living in a residence hall should receive a dose if she did not receive a dose on or after her 16th birthday. Adults who have certain high-risk conditions should receive one or more doses of vaccine.  Hepatitis A vaccine. Adults who wish to be protected from this disease, have certain high-risk conditions, work with hepatitis A-infected animals, work in hepatitis A research labs, or travel to or work in countries with a high rate of hepatitis A should be  immunized. Adults who were previously unvaccinated and who anticipate close contact with an international adoptee during the first 60 days after arrival in the [place] from a country with a high rate of hepatitis A should be immunized.  Hepatitis B vaccine.  Adults who wish to be protected from this disease, have certain high-risk conditions, may be exposed to blood or other infectious  body fluids, are household contacts or sex partners of hepatitis B positive people, are clients or workers in certain care facilities, or travel to or work in countries with a high rate of hepatitis B should be immunized.  Haemophilus influenzae type b (Hib) vaccine. A previously unvaccinated person with asplenia or sickle cell disease or having a scheduled splenectomy should receive 1 dose of Hib vaccine. Regardless of previous immunization, a recipient of a hematopoietic stem cell transplant should receive a 3-dose series 6 12 months after her successful transplant. Hib vaccine is not recommended for adults with HIV infection.  Preventive Services / Frequency Ages 6 to 39years  Blood pressure check.** / Every 1 to 2 years.  Lipid and cholesterol check.** / Every 5 years beginning at age 39.  Clinical breast exam.** / Every 3 years for women in their 61s and 62s.  BRCA-related cancer risk assessment.** / For women who have family members with a BRCA-related cancer (breast, ovarian, tubal, or peritoneal cancers).  Pap test.** / Every 2 years from ages 47 through 85. Every 3 years starting at age 34 through age 12 or 74 with a history of 3 consecutive normal Pap tests.  HPV screening.** / Every 3 years from ages 46 through ages 43 to 54 with a history of 3 consecutive normal Pap tests.  Hepatitis C blood test.** / For any individual with known risks for hepatitis C.  Skin self-exam. / Monthly.  Influenza vaccine. / Every year.  Tetanus, diphtheria, and acellular pertussis (Tdap, Td) vaccine.** / Consult your health care provider. Pregnant women should receive 1 dose of Tdap vaccine during each pregnancy. 1 dose of Td every 10 years.  Varicella vaccine.** / Consult your health care provider. Pregnant females who do not have evidence of immunity should receive the first dose after pregnancy.  HPV vaccine. / 3 doses over 6 months, if 64 and younger. The vaccine is not recommended for use in  pregnant females. However, pregnancy testing is not needed before receiving a dose.  Measles, mumps, rubella (MMR) vaccine.** / You need at least 1 dose of MMR if you were born in 1957 or later. You may also need a 2nd dose. For females of childbearing age, rubella immunity should be determined. If there is no evidence of immunity, females who are not pregnant should be vaccinated. If there is no evidence of immunity, females who are pregnant should delay immunization until after pregnancy.  Pneumococcal 13-valent conjugate (PCV13) vaccine.** / Consult your health care provider.  Pneumococcal polysaccharide (PPSV23) vaccine.** / 1 to 2 doses if you smoke cigarettes or if you have certain conditions.  Meningococcal vaccine.** / 1 dose if you are age 71 to 39 years and a Market researcher living in a residence hall, or have one of several medical conditions, you need to get vaccinated against meningococcal disease. You may also need additional booster doses.  Hepatitis A vaccine.** / Consult your health care provider.  Hepatitis B vaccine.** / Consult your health care provider.  Haemophilus influenzae type b (Hib) vaccine.** / Consult your health care provider.  Ages 55 to 64years  Blood pressure check.** / Every 1 to 2 years.  Lipid and cholesterol check.** / Every 5 years beginning at age 20 years.  Lung cancer screening. / Every year if you are aged 55 80 years and have a 30-pack-year history of smoking and currently smoke or have quit within the past 15 years. Yearly screening is stopped once you have quit smoking for at least 15 years or develop a health problem that would prevent you from having lung cancer treatment.  Clinical breast exam.** / Every year after age 40 years.  BRCA-related cancer risk assessment.** / For women who have family members with a BRCA-related cancer (breast, ovarian, tubal, or peritoneal cancers).  Mammogram.** / Every year beginning at age 40  years and continuing for as long as you are in good health. Consult with your health care provider.  Pap test.** / Every 3 years starting at age 30 years through age 65 or 70 years with a history of 3 consecutive normal Pap tests.  HPV screening.** / Every 3 years from ages 30 years through ages 65 to 70 years with a history of 3 consecutive normal Pap tests.  Fecal occult blood test (FOBT) of stool. / Every year beginning at age 50 years and continuing until age 75 years. You may not need to do this test if you get a colonoscopy every 10 years.  Flexible sigmoidoscopy or colonoscopy.** / Every 5 years for a flexible sigmoidoscopy or every 10 years for a colonoscopy beginning at age 50 years and continuing until age 75 years.  Hepatitis C blood test.** / For all people born from 1945 through 1965 and any individual with known risks for hepatitis C.  Skin self-exam. / Monthly.  Influenza vaccine. / Every year.  Tetanus, diphtheria, and acellular pertussis (Tdap/Td) vaccine.** / Consult your health care provider. Pregnant women should receive 1 dose of Tdap vaccine during each pregnancy. 1 dose of Td every 10 years.  Varicella vaccine.** / Consult your health care provider. Pregnant females who do not have evidence of immunity should receive the first dose after pregnancy.  Zoster vaccine.** / 1 dose for adults aged 60 years or older.  Measles, mumps, rubella (MMR) vaccine.** / You need at least 1 dose of MMR if you were born in 1957 or later. You may also need a 2nd dose. For females of childbearing age, rubella immunity should be determined. If there is no evidence of immunity, females who are not pregnant should be vaccinated. If there is no evidence of immunity, females who are pregnant should delay immunization until after pregnancy.  Pneumococcal 13-valent conjugate (PCV13) vaccine.** / Consult your health care provider.  Pneumococcal polysaccharide (PPSV23) vaccine.** / 1 to 2 doses if  you smoke cigarettes or if you have certain conditions.  Meningococcal vaccine.** / Consult your health care provider.  Hepatitis A vaccine.** / Consult your health care provider.  Hepatitis B vaccine.** / Consult your health care provider.  Haemophilus influenzae type b (Hib) vaccine.** / Consult your health care provider.  Ages 65 years and over  Blood pressure check.** / Every 1 to 2 years.  Lipid and cholesterol check.** / Every 5 years beginning at age 20 years.  Lung cancer screening. / Every year if you are aged 55 80 years and have a 30-pack-year history of smoking and currently smoke or have quit within the past 15 years. Yearly screening is stopped once you have quit smoking for at least 15 years or develop a health problem that   would prevent you from having lung cancer treatment.  Clinical breast exam.** / Every year after age 103 years.  BRCA-related cancer risk assessment.** / For women who have family members with a BRCA-related cancer (breast, ovarian, tubal, or peritoneal cancers).  Mammogram.** / Every year beginning at age 36 years and continuing for as long as you are in good health. Consult with your health care provider.  Pap test.** / Every 3 years starting at age 5 years through age 85 or 10 years with 3 consecutive normal Pap tests. Testing can be stopped between 65 and 70 years with 3 consecutive normal Pap tests and no abnormal Pap or HPV tests in the past 10 years.  HPV screening.** / Every 3 years from ages 93 years through ages 70 or 45 years with a history of 3 consecutive normal Pap tests. Testing can be stopped between 65 and 70 years with 3 consecutive normal Pap tests and no abnormal Pap or HPV tests in the past 10 years.  Fecal occult blood test (FOBT) of stool. / Every year beginning at age 8 years and continuing until age 45 years. You may not need to do this test if you get a colonoscopy every 10 years.  Flexible sigmoidoscopy or colonoscopy.** /  Every 5 years for a flexible sigmoidoscopy or every 10 years for a colonoscopy beginning at age 69 years and continuing until age 68 years.  Hepatitis C blood test.** / For all people born from [birth year] through 1963 and any individual with known risks for hepatitis C.  Osteoporosis screening.** / A one-time screening for women ages 7 years and over and women at risk for fractures or osteoporosis.  Skin self-exam. / Monthly.  Influenza vaccine. / Every year.  Tetanus, diphtheria, and acellular pertussis (Tdap/Td) vaccine.** / 1 dose of Td every 10 years.  Varicella vaccine.** / Consult your health care provider.  Zoster vaccine.** / 1 dose for adults aged 5 years or older.  Pneumococcal 13-valent conjugate (PCV13) vaccine.** / Consult your health care provider.  Pneumococcal polysaccharide (PPSV23) vaccine.** / 1 dose for all adults aged 74 years and older.  Meningococcal vaccine.** / Consult your health care provider.  Hepatitis A vaccine.** / Consult your health care provider.  Hepatitis B vaccine.** / Consult your health care provider.  Haemophilus influenzae type b (Hib) vaccine.** / Consult your health care provider. ** Family history and personal history of risk and conditions may change your health care provider's recommendations. Document Released: 06/29/2001 Document Revised: 02/21/2013  [facility] Patient Information 2014 [place], [place].   EXERCISE AND DIET:  We recommended that you start or continue a regular exercise program for good health. Regular exercise means any activity that makes your heart beat faster and makes you sweat.  We recommend exercising at least 30 minutes per day at least 3 days a week, preferably 5.  We also recommend a diet low in fat and sugar / carbohydrates.  Inactivity, poor dietary choices and obesity can cause diabetes, heart attack, stroke, and kidney damage, among others.     ALCOHOL AND SMOKING:  Women should limit their alcohol intake to no  more than 7 drinks/beers/glasses of wine (combined, not each!) per week. Moderation of alcohol intake to this level decreases your risk of breast cancer and liver damage.  ( And of course, no recreational drugs are part of a healthy lifestyle.)  Also, you should not be smoking at all or even being exposed to second hand smoke. Most people know smoking can  cause cancer, and various heart and lung diseases, but did you know it also contributes to weakening of your bones?  Aging of your skin?  Yellowing of your teeth and nails?   CALCIUM AND VITAMIN D:  Adequate intake of calcium and Vitamin D are recommended.  The recommendations for exact amounts of these supplements seem to change often, but generally speaking 600 mg of calcium (either carbonate or citrate) and 800 units of Vitamin D per day seems prudent. Certain women may benefit from higher intake of Vitamin D.  If you are among these women, your doctor will have told you during your visit.     PAP SMEARS:  Pap smears, to check for cervical cancer or precancers,  have traditionally been done yearly, although recent scientific advances have shown that most women can have pap smears less often.  However, every woman still should have a physical exam from her gynecologist or primary care physician every year. It will include a breast check, inspection of the vulva and vagina to check for abnormal growths or skin changes, a visual exam of the cervix, and then an exam to evaluate the size and shape of the uterus and ovaries.  And after 52 years of age, a rectal exam is indicated to check for rectal cancers. We will also provide age appropriate advice regarding health maintenance, like when you should have certain vaccines, screening for sexually transmitted diseases, bone density testing, colonoscopy, mammograms, etc.    MAMMOGRAMS:  All women over 71 years old should have a yearly mammogram. Many facilities now offer a "3D" mammogram, which may cost  around $50 extra out of pocket. If possible,  we recommend you accept the option to have the 3D mammogram performed.  It both reduces the number of women who will be called back for extra views which then turn out to be normal, and it is better than the routine mammogram at detecting truly abnormal areas.     COLONOSCOPY:  Colonoscopy to screen for colon cancer is recommended for all women at age 52.  We know, you hate the idea of the prep.  We agree, BUT, having colon cancer and not knowing it is worse!!  Colon cancer so often starts as a polyp that can be seen and removed at colonscopy, which can quite literally save your life!  And if your first colonoscopy is normal and you have no family history of colon cancer, most women don't have to have it again for 10 years.  Once every ten years, you can do something that may end up saving your life, right?  We will be happy to help you get it scheduled when you are ready.  Be sure to check your insurance coverage so you understand how much it will cost.  It may be covered as a preventative service at no cost, but you should check your particular policy.
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
## 2019-08-19 ENCOUNTER — Other Ambulatory Visit: Payer: Self-pay | Admitting: Family Medicine

## 2019-08-20 MED ORDER — VYVANSE 70 MG PO CAPS: 70.0000 mg | ORAL_CAPSULE | Freq: Every day | ORAL | 0 refills | Status: DC

## 2019-09-17 ENCOUNTER — Other Ambulatory Visit: Payer: Self-pay | Admitting: Physician Assistant

## 2019-09-17 DIAGNOSIS — F909 Attention-deficit hyperactivity disorder, unspecified type: Secondary | ICD-10-CM

## 2019-09-17 MED ORDER — VYVANSE 70 MG PO CAPS: 70.0000 mg | ORAL_CAPSULE | Freq: Every day | ORAL | 0 refills | Status: DC

## 2019-09-17 NOTE — Telephone Encounter (Signed)
Patient is requesting a refill of her Vyvanse, if approved please send to [facility] on [address]

## 2019-09-17 NOTE — Telephone Encounter (Signed)
Patient last seen 07/2019 and advised: Return in about 3 months (around 10/20/2019) for ADD, MMP.    Last refill given 08/20/19 #30 no RF.   Please approve if deemed appropriate. AS, CMA

## 2019-09-17 NOTE — Addendum Note (Signed)
Addended by: Sylvester Harder on: 09/17/2019 10:40 AM   Modules accepted: Orders

## 2019-10-03 ENCOUNTER — Encounter: Payer: Self-pay | Admitting: Family Medicine

## 2019-10-17 ENCOUNTER — Other Ambulatory Visit: Payer: Self-pay | Admitting: Family Medicine

## 2019-10-18 ENCOUNTER — Other Ambulatory Visit: Payer: Self-pay | Admitting: Physician Assistant

## 2019-10-18 DIAGNOSIS — F909 Attention-deficit hyperactivity disorder, unspecified type: Secondary | ICD-10-CM

## 2019-10-19 ENCOUNTER — Other Ambulatory Visit: Payer: Self-pay | Admitting: Physician Assistant

## 2019-10-19 DIAGNOSIS — F909 Attention-deficit hyperactivity disorder, unspecified type: Secondary | ICD-10-CM

## 2019-10-19 MED ORDER — VYVANSE 70 MG PO CAPS: 70.0000 mg | ORAL_CAPSULE | Freq: Every day | ORAL | 0 refills | Status: DC

## 2019-10-19 NOTE — Telephone Encounter (Signed)
Patient last seen 07/20/19 by Opalski and advised to follow up in 3 months.   Last refill of Vyvance given 09/17/19 #30 no refills.   Patient has apt scheduled for 6/17 at 345pm.   Please approve if refill deemed appropriate. AS, CMA

## 2019-10-22 ENCOUNTER — Other Ambulatory Visit: Payer: Self-pay | Admitting: Physician Assistant

## 2019-10-22 MED ORDER — BUSPIRONE HCL 10 MG PO TABS: 10.0000 mg | ORAL_TABLET | Freq: Three times a day (TID) | ORAL | 0 refills | Status: DC

## 2019-10-22 NOTE — Telephone Encounter (Signed)
#  30 day supply of Buspar sent to requested pharmacy.   Please have patient refer back to Controlled Medication Contract. AS, CMA

## 2019-10-22 NOTE — Telephone Encounter (Signed)
Patient called states she has unhappy that only 15tab approved for her Vyvanse & wishes her dissatisfaction shared says scheduling issues not her faults ( but 6/17 appt made 3/5?)--   pt also states needs refill on :   busPIRone (BUSPAR) 10 MG tablet [[id number]]   Order Details Dose: 10 mg Route: Oral Frequency: 3 times daily  Dispense Quantity: 90 tablet Refills: 2       Sig: Take 1 tablet (10 mg total) by mouth 3 (three) times daily.   --Forwarding message & request to med asst --if refill approved pls send to :   [id number] [place], [place] - [address] [phone] (Phone) [phone] (Fax)   --glh

## 2019-11-01 ENCOUNTER — Encounter: Payer: Self-pay | Admitting: Physician Assistant

## 2019-11-01 ENCOUNTER — Other Ambulatory Visit: Payer: Self-pay

## 2019-11-01 ENCOUNTER — Ambulatory Visit (INDEPENDENT_AMBULATORY_CARE_PROVIDER_SITE_OTHER): Payer: 59 | Admitting: Physician Assistant

## 2019-11-01 VITALS — BP 138/74 | HR 85 | Temp 98.0°F | Ht 64.0 in | Wt 201.1 lb

## 2019-11-01 DIAGNOSIS — Z1211 Encounter for screening for malignant neoplasm of colon: Secondary | ICD-10-CM | POA: Diagnosis not present

## 2019-11-01 DIAGNOSIS — F329 Major depressive disorder, single episode, unspecified: Secondary | ICD-10-CM

## 2019-11-01 DIAGNOSIS — F909 Attention-deficit hyperactivity disorder, unspecified type: Secondary | ICD-10-CM | POA: Diagnosis not present

## 2019-11-01 DIAGNOSIS — E039 Hypothyroidism, unspecified: Secondary | ICD-10-CM

## 2019-11-01 DIAGNOSIS — F32A Depression, unspecified: Secondary | ICD-10-CM

## 2019-11-01 DIAGNOSIS — R69 Illness, unspecified: Secondary | ICD-10-CM | POA: Diagnosis not present

## 2019-11-01 MED ORDER — BUSPIRONE HCL 10 MG PO TABS: 10.0000 mg | ORAL_TABLET | Freq: Three times a day (TID) | ORAL | 3 refills | Status: DC

## 2019-11-01 MED ORDER — VYVANSE 70 MG PO CAPS: 70.0000 mg | ORAL_CAPSULE | Freq: Every day | ORAL | 0 refills | Status: DC
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
## 2019-11-05 ENCOUNTER — Encounter: Payer: Self-pay | Admitting: Gastroenterology

## 2019-11-09 ENCOUNTER — Encounter: Payer: Self-pay | Admitting: Physician Assistant

## 2019-11-09 DIAGNOSIS — F909 Attention-deficit hyperactivity disorder, unspecified type: Secondary | ICD-10-CM

## 2019-11-18 ENCOUNTER — Other Ambulatory Visit: Payer: Self-pay | Admitting: Physician Assistant

## 2019-11-18 DIAGNOSIS — F909 Attention-deficit hyperactivity disorder, unspecified type: Secondary | ICD-10-CM

## 2019-11-20 MED ORDER — VYVANSE 70 MG PO CAPS: 70.0000 mg | ORAL_CAPSULE | Freq: Every day | ORAL | 0 refills | Status: DC

## 2019-11-20 NOTE — Telephone Encounter (Signed)
Patient last seen 11/01/19 and advised to follow up in 3 months.   Last refill given 11/01/19 #30 with no refills.    Please approve refill if deemed appropriate. AS, CMA

## 2019-11-21 NOTE — Telephone Encounter (Signed)
Prior Auth sent to plan and came back stating no prior auth needed.   Patient is aware of this and verbalized understanding and stated that she had already spoken with insurance company and was aware. AS, CMA

## 2019-11-23 ENCOUNTER — Encounter: Payer: Self-pay | Admitting: Gastroenterology

## 2019-11-23 ENCOUNTER — Ambulatory Visit (AMBULATORY_SURGERY_CENTER): Payer: Self-pay

## 2019-11-23 ENCOUNTER — Other Ambulatory Visit: Payer: Self-pay

## 2019-11-23 VITALS — Ht 64.0 in | Wt 200.0 lb

## 2019-11-23 DIAGNOSIS — Z1211 Encounter for screening for malignant neoplasm of colon: Secondary | ICD-10-CM

## 2019-11-23 NOTE — Progress Notes (Signed)
No egg or soy allergy known to patient  No issues with past sedation with any surgeries  or procedures, no intubation problems  No diet pills per patient No home 02 use per patient  No blood thinners per patient  Pt denies issues with constipation  No A fib or A flutter  EMMI video sent to pt's e mail  COVID 19 guidelines implemented in [place] today   Pt has been vaccinated for covid  Due to the COVID-19 pandemic we are asking patients to follow these guidelines. Please only bring one care partner. Please be aware that your care partner may wait in the car in the parking lot or if they feel like they will be too hot to wait in the car, they may wait in the lobby on the 4th floor. All care partners are required to wear a mask the entire time (we do not have any that we can provide them), they need to practice social distancing, and we will do a Covid check for all patient's and care partners when you arrive. Also we will check their temperature and your temperature. If the care partner waits in their car they need to stay in the parking lot the entire time and we will call them on their cell phone when the patient is ready for discharge so they can bring the car to the front of the building. Also all patient's will need to wear a mask into building.  

## 2019-11-30 ENCOUNTER — Other Ambulatory Visit: Payer: Self-pay

## 2019-11-30 MED ORDER — SUTAB 1479-225-188 MG PO TABS: ORAL_TABLET | ORAL | 0 refills | Status: DC

## 2019-12-04 ENCOUNTER — Ambulatory Visit (AMBULATORY_SURGERY_CENTER): Payer: 59 | Admitting: Gastroenterology

## 2019-12-04 ENCOUNTER — Encounter: Payer: Self-pay | Admitting: Gastroenterology

## 2019-12-04 ENCOUNTER — Other Ambulatory Visit: Payer: Self-pay

## 2019-12-04 VITALS — BP 122/74 | HR 73 | Temp 98.0°F | Resp 13 | Ht 64.0 in | Wt 200.0 lb

## 2019-12-04 DIAGNOSIS — Z1211 Encounter for screening for malignant neoplasm of colon: Secondary | ICD-10-CM

## 2019-12-04 MED ORDER — SODIUM CHLORIDE 0.9 % IV SOLN: 500.0000 mL | Freq: Once | INTRAVENOUS | Status: DC

## 2019-12-04 NOTE — Progress Notes (Signed)
VS taken by C.W. 

## 2019-12-04 NOTE — Patient Instructions (Addendum)
Handouts given:  Diverticulosis, HIgh Fiber Diet Resume previous diet while increasing fiber Increase daily water intake Continue present medications Repeat colonoscopy in 10 years for screening purposes  YOU HAD AN ENDOSCOPIC PROCEDURE TODAY AT THE [facility]:   Refer to the procedure report that was given to you for any specific questions about what was found during the examination.  If the procedure report does not answer your questions, please call your gastroenterologist to clarify.  If you requested that your care partner not be given the details of your procedure findings, then the procedure report has been included in a sealed envelope for you to review at your convenience later.  YOU SHOULD EXPECT: Some feelings of bloating in the abdomen. Passage of more gas than usual.  Walking can help get rid of the air that was put into your GI tract during the procedure and reduce the bloating. If you had a lower endoscopy (such as a colonoscopy or flexible sigmoidoscopy) you may notice spotting of blood in your stool or on the toilet paper. If you underwent a bowel prep for your procedure, you may not have a normal bowel movement for a few days.  Please Note:  You might notice some irritation and congestion in your nose or some drainage.  This is from the oxygen used during your procedure.  There is no need for concern and it should clear up in a day or so.  SYMPTOMS TO REPORT IMMEDIATELY:   Following lower endoscopy (colonoscopy or flexible sigmoidoscopy):  Excessive amounts of blood in the stool  Significant tenderness or worsening of abdominal pains  Swelling of the abdomen that is new, acute  Fever of 100F or higher    For urgent or emergent issues, a gastroenterologist can be reached at any hour by calling [phone]. Do not use MyChart messaging for urgent concerns.    DIET:  We do recommend a small meal at first, but then you may proceed to your regular diet.   Drink plenty of fluids but you should avoid alcoholic beverages for 24 hours.  ACTIVITY:  You should plan to take it easy for the rest of today and you should NOT DRIVE or use heavy machinery until tomorrow (because of the sedation medicines used during the test).    FOLLOW UP: Our staff will call the number listed on your records 48-72 hours following your procedure to check on you and address any questions or concerns that you may have regarding the information given to you following your procedure. If we do not reach you, we will leave a message.  We will attempt to reach you two times.  During this call, we will ask if you have developed any symptoms of COVID 19. If you develop any symptoms (ie: fever, flu-like symptoms, shortness of breath, cough etc.) before then, please call [phone].  If you test positive for Covid 19 in the 2 weeks post procedure, please call and report this information to [place].    If any biopsies were taken you will be contacted by phone or by letter within the next 1-3 weeks.  Please call us at [phone] if you have not heard about the biopsies in 3 weeks.    SIGNATURES/CONFIDENTIALITY: You and/or your care partner have signed paperwork which will be entered into your electronic medical record.  These signatures attest to the fact that that the information above on your After Visit Summary has been reviewed and is understood.  Full responsibility of  the confidentiality of this discharge information lies with you and/or your care-partner. 

## 2019-12-04 NOTE — Op Note (Signed)
[facility] Patient Name: Laurie Arnold Procedure Date: 12/04/2019 3:04 PM MRN: [id number] Endoscopist: Sherilyn Cooter L. Myrtie Neither , MD Age: 54 Referring MD:  Date of Birth: May 04, 1966 Gender: Female Account #: [id number] Procedure:                Colonoscopy Indications:              Screening for colorectal malignant neoplasm, This                            is the patient's first screening colonoscopy Medicines:                Monitored Anesthesia Care Procedure:                Pre-Anesthesia Assessment:                           - Prior to the procedure, a History and Physical                            was performed, and patient medications and                            allergies were reviewed. The patient's tolerance of                            previous anesthesia was also reviewed. The risks                            and benefits of the procedure and the sedation                            options and risks were discussed with the patient.                            All questions were answered, and informed consent                            was obtained. Prior Anticoagulants: The patient has                            taken no previous anticoagulant or antiplatelet                            agents. ASA Grade Assessment: II - A patient with                            mild systemic disease. After reviewing the risks                            and benefits, the patient was deemed in                            satisfactory condition to undergo the procedure.  After obtaining informed consent, the colonoscope                            was passed under direct vision. Throughout the                            procedure, the patient's blood pressure, pulse, and                            oxygen saturations were monitored continuously. The                            Colonoscope was introduced through the anus and                            advanced to the  the cecum, identified by                            appendiceal orifice and ileocecal valve. The                            colonoscopy was performed without difficulty. The                            patient tolerated the procedure well. The quality                            of the bowel preparation was good. The ileocecal                            valve, appendiceal orifice, and rectum were                            photographed. The bowel preparation used was                            Miralax. Scope In: 3:30:11 PM Scope Out: 3:44:51 PM Scope Withdrawal Time: 0 hours 8 minutes 6 seconds  Total Procedure Duration: 0 hours 14 minutes 40 seconds  Findings:                 The perianal and digital rectal examinations were                            normal.                           A few small-mouthed diverticula were found in the                            right colon.                           The exam was otherwise without abnormality on  direct and retroflexion views. Complications:            No immediate complications. Estimated Blood Loss:     Estimated blood loss: none. Impression:               - Diverticulosis in the right colon.                           - The examination was otherwise normal on direct                            and retroflexion views.                           - No specimens collected. Recommendation:           - Patient has a contact number available for                            emergencies. The signs and symptoms of potential                            delayed complications were discussed with the                            patient. Return to normal activities tomorrow.                            Written discharge instructions were provided to the                            patient.                           - Resume previous diet.                           - Continue present medications.                           - Repeat  colonoscopy in 10 years for screening                            purposes. Mariam Helbert L. Myrtie Neither, MD 12/04/2019 3:50:02 PM This report has been signed electronically.

## 2019-12-04 NOTE — Progress Notes (Signed)
Pt's states no medical or surgical changes since previsit or office visit. 

## 2019-12-04 NOTE — Progress Notes (Signed)
PT taken to PACU. Monitors in place. VSS. Report given to RN. 

## 2019-12-06 ENCOUNTER — Telehealth: Payer: Self-pay

## 2019-12-06 NOTE — Telephone Encounter (Signed)
  Follow up Call-  Call back number 12/04/2019  Post procedure Call Back phone  # [phone]  Permission to leave phone message Yes  Some recent data might be hidden     Patient questions:  Do you have a fever, pain , or abdominal swelling? No. Pain Score  0 *  Have you tolerated food without any problems? Yes.    Have you been able to return to your normal activities? Yes.    Do you have any questions about your discharge instructions: Diet   No. Medications  No. Follow up visit  No.  Do you have questions or concerns about your Care? No.  Actions: * If pain score is 4 or above: No action needed, pain <4.  1. Have you developed a fever since your procedure? no  2.   Have you had an respiratory symptoms (SOB or cough) since your procedure? no  3.   Have you tested positive for COVID 19 since your procedure no  4.   Have you had any family members/close contacts diagnosed with the COVID 19 since your procedure?  no   If yes to any of these questions please route to Laverna Peace, RN and Charlett Lango, RN

## 2019-12-18 ENCOUNTER — Other Ambulatory Visit: Payer: Self-pay | Admitting: Endocrinology

## 2019-12-18 ENCOUNTER — Other Ambulatory Visit: Payer: Self-pay | Admitting: Physician Assistant

## 2019-12-18 DIAGNOSIS — F909 Attention-deficit hyperactivity disorder, unspecified type: Secondary | ICD-10-CM

## 2019-12-18 MED ORDER — VYVANSE 70 MG PO CAPS: 70.0000 mg | ORAL_CAPSULE | Freq: Every day | ORAL | 0 refills | Status: DC

## 2019-12-18 NOTE — Telephone Encounter (Signed)
Patient last seen 11/01/19 and advised to follow up 3 months.  Last refill given 11/20/19 #30 no refills.    Please approve if refill deemed appropriate. AS, CMA

## 2019-12-19 ENCOUNTER — Encounter: Payer: Self-pay | Admitting: Physician Assistant

## 2019-12-19 DIAGNOSIS — F909 Attention-deficit hyperactivity disorder, unspecified type: Secondary | ICD-10-CM

## 2019-12-20 ENCOUNTER — Encounter: Payer: Self-pay | Admitting: Physician Assistant

## 2019-12-20 MED ORDER — VYVANSE 70 MG PO CAPS: 70.0000 mg | ORAL_CAPSULE | Freq: Every day | ORAL | 0 refills | Status: DC

## 2019-12-20 NOTE — Telephone Encounter (Signed)
Patient is aware med sent to pharmacy. AS, CMA

## 2019-12-20 NOTE — Telephone Encounter (Signed)
Sp[facility] who states that he will have to delete the prescription that was sent on 12/18/19 and send a new prescription with start date of 12/21/19.

## 2019-12-24 ENCOUNTER — Other Ambulatory Visit: Payer: Self-pay | Admitting: Physician Assistant

## 2019-12-24 DIAGNOSIS — F909 Attention-deficit hyperactivity disorder, unspecified type: Secondary | ICD-10-CM

## 2019-12-24 MED ORDER — VYVANSE 70 MG PO CAPS: 70.0000 mg | ORAL_CAPSULE | Freq: Every day | ORAL | 0 refills | Status: DC

## 2019-12-25 ENCOUNTER — Encounter: Payer: Self-pay | Admitting: Physician Assistant

## 2019-12-25 MED ORDER — BUPROPION HCL ER (XL) 300 MG PO TB24: 300.0000 mg | ORAL_TABLET | Freq: Every day | ORAL | 2 refills | Status: DC

## 2019-12-28 ENCOUNTER — Encounter: Payer: Self-pay | Admitting: Endocrinology

## 2019-12-28 ENCOUNTER — Other Ambulatory Visit: Payer: Self-pay

## 2019-12-28 DIAGNOSIS — E039 Hypothyroidism, unspecified: Secondary | ICD-10-CM

## 2019-12-28 MED ORDER — LEVOTHYROXINE SODIUM 88 MCG PO TABS: 88.0000 ug | ORAL_TABLET | Freq: Every day | ORAL | 2 refills | Status: DC

## 2020-01-24 ENCOUNTER — Encounter: Payer: Self-pay | Admitting: Physician Assistant

## 2020-01-24 ENCOUNTER — Other Ambulatory Visit: Payer: Self-pay

## 2020-01-24 ENCOUNTER — Ambulatory Visit (INDEPENDENT_AMBULATORY_CARE_PROVIDER_SITE_OTHER): Payer: 59 | Admitting: Physician Assistant

## 2020-01-24 DIAGNOSIS — F909 Attention-deficit hyperactivity disorder, unspecified type: Secondary | ICD-10-CM

## 2020-01-24 DIAGNOSIS — R69 Illness, unspecified: Secondary | ICD-10-CM | POA: Diagnosis not present

## 2020-01-24 MED ORDER — LISDEXAMFETAMINE DIMESYLATE 70 MG PO CAPS: 70.0000 mg | ORAL_CAPSULE | Freq: Every day | ORAL | 0 refills | Status: DC

## 2020-01-24 MED ORDER — LISDEXAMFETAMINE DIMESYLATE 70 MG PO CAPS
70.0000 mg | ORAL_CAPSULE | Freq: Every day | ORAL | 0 refills | Status: DC
Start: 2020-03-20 — End: 2020-06-16

## 2020-01-24 MED ORDER — VYVANSE 70 MG PO CAPS: 70.0000 mg | ORAL_CAPSULE | Freq: Every day | ORAL | 0 refills | Status: DC

## 2020-01-24 NOTE — Progress Notes (Signed)
Telehealth office visit note for Laurie Masker, PA-C- at Primary Care at [facility]   I connected with current patient today by telephone and verified that I am speaking with the correct person    Location of the patient: Home  Location of the provider: Office - This visit type was conducted due to national recommendations for restrictions regarding the COVID-19 Pandemic (e.g. social distancing) in an effort to limit this patient's exposure and mitigate transmission in our community.    - No physical exam could be performed with this format, beyond that communicated to [place] by the patient/ family members as noted.   - Additionally my office staff/ schedulers were to discuss with the patient that there may be a monetary charge related to this service, depending on their medical insurance.  My understanding is that patient understood and consented to proceed.     _________________________________________________________________________________   History of Present Illness: Pt calls in to follow up on ADHD. Pt does not have any acute concerns today. Reports Vyvanse is essential to perform her job duties. 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 Taking medication without issues.      GAD 7 : Generalized Anxiety Score 11/28/2018  Nervous, Anxious, on Edge 0  Control/stop worrying 0  Worry too much - different things 0  Trouble relaxing 0  Restless 0  Easily annoyed or irritable 1  Afraid - awful might happen 0  Total GAD 7 Score 1  Anxiety Difficulty Not difficult at all    Depression screen [facility] 2/9 01/24/2020 11/01/2019 07/20/2019 02/27/2019 11/28/2018  Decreased Interest 0 0 0 0 0  Down, Depressed, Hopeless 0 0 0 0 0  PHQ - 2 Score 0 0 0 0 0  Altered sleeping 0 0 0 0 0  Tired, decreased energy 0 0 0 0 1  Change in appetite 0 0 0 0 0  Feeling bad or failure about yourself  0 0 0 0 0  Trouble concentrating 0 0 0 1 0  Moving slowly or fidgety/restless 0 0 0 0 0  Suicidal  thoughts 0 0 0 0 0  PHQ-9 Score 0 0 0 1 1  Difficult doing work/chores - - - Not difficult at all Not difficult at all  Some recent data might be hidden      Impression and Recommendations:     1. Attention deficit hyperactivity disorder (ADHD), unspecified ADHD type     ADHD: -Stable -Reviewed PDMP, no aberancies noted. Provided 90 day supply (3 diff. Rx's). -Follow up in 3 months for medication management.    - As part of my medical decision making, I reviewed the following data within the electronic MEDICAL RECORD NUMBER History obtained from pt /family, CMA notes reviewed and incorporated if applicable, Labs reviewed, Radiograph/ tests reviewed if applicable and OV notes from prior OV's with me, as well as any other specialists she/he has seen since seeing me last, were all reviewed and used in my medical decision making process today.    - Additionally, when appropriate, discussion had with patient regarding our treatment plan, and their biases/concerns about that plan were used in my medical decision making today.    - The patient agreed with the plan and demonstrated an understanding of the instructions.   No barriers to understanding were identified.     - The patient was advised to call back or seek an in-person evaluation if the symptoms worsen or if the condition fails to improve as anticipated.  No follow-ups on file.    No orders of the defined types were placed in this encounter.   Meds ordered this encounter  Medications   VYVANSE 70 MG capsule    Sig: Take 1 capsule (70 mg total) by mouth daily.    Dispense:  30 capsule    Refill:  0    Order Specific Question:   Supervising Provider    Answer:   Nani Gasser D [[id number]]   lisdexamfetamine (VYVANSE) 70 MG capsule    Sig: Take 1 capsule (70 mg total) by mouth daily.    Dispense:  30 capsule    Refill:  0    Order Specific Question:   Supervising Provider    Answer:   Nani Gasser D [[id number]]    lisdexamfetamine (VYVANSE) 70 MG capsule    Sig: Take 1 capsule (70 mg total) by mouth daily.    Dispense:  30 capsule    Refill:  0    Order Specific Question:   Supervising Provider    Answer:   Nani Gasser D [[id number]]    Medications Discontinued During This Encounter  Medication Reason   VYVANSE 70 MG capsule Reorder       Time spent on visit including pre-visit chart review and post-visit care was 7 minutes.      The 21st Century Cures Act was signed into law in 2016 which includes the topic of electronic health records.  This provides immediate access to information in MyChart.  This includes consultation notes, operative notes, office notes, lab results and pathology reports.  If you have any questions about what you read please let us know at your next visit or call us at the office.  We are right here with you.  Note:  This note was prepared with assistance of Dragon voice recognition software. Occasional wrong-word or sound-a-like substitutions may have occurred due to the inherent limitations of voice recognition software.   __________________________________________________________________________________     Patient Care Team    Relationship Specialty Notifications Start End  Laurie Arnold, [place] PCP - General Physician Assistant  10/03/19      -Vitals obtained; medications/ allergies reconciled;  personal medical, social, Sx etc.histories were updated by CMA, reviewed by me and are reflected in chart   Patient Active Problem List   Diagnosis Date Noted   Class 1 obesity in adult 07/20/2019   Urinary frequency 02/27/2019   Cough in adult 02/21/2019   Palpitations 03/21/2018   Elevated LDL cholesterol level 12/07/2017   Depression 12/07/2017   Screening for breast cancer 12/07/2017   Hypothyroidism 12/07/2017   Mass of anterior abdominal wall 12/06/2017   Healthcare maintenance 10/18/2017   BMI 34.0-34.9,adult 10/18/2017   External  hemorrhoids 10/18/2017   Attention deficit hyperactivity disorder (ADHD) 10/18/2017   GERD (gastroesophageal reflux disease) 10/18/2017   Elevated LFTs 10/18/2017   Eye discharge 10/18/2017   Fatigue 10/18/2017     Current Meds  Medication Sig   Acetaminophen (TYLENOL ARTHRITIS PAIN PO) Take by mouth as needed.   buPROPion (WELLBUTRIN XL) 300 MG 24 hr tablet Take 1 tablet (300 mg total) by mouth daily.   busPIRone (BUSPAR) 10 MG tablet Take 1 tablet (10 mg total) by mouth 3 (three) times daily.   cetirizine (ZYRTEC) 10 MG tablet Take 1 tablet by mouth daily.   famotidine (PEPCID) 20 MG tablet Take 20 mg by mouth daily.    levothyroxine (SYNTHROID) 88 MCG tablet Take 1 tablet (88 mcg total) by mouth daily.   [  START ON 02/18/2020] VYVANSE 70 MG capsule Take 1 capsule (70 mg total) by mouth daily.   [DISCONTINUED] VYVANSE 70 MG capsule Take 1 capsule (70 mg total) by mouth daily.     Allergies:  No Known Allergies   ROS:  See above HPI for pertinent positives and negatives   Objective:   Blood pressure 123/87, pulse 89, height 5\' 5"  (1.651 m), weight 201 lb (91.2 kg), SpO2 98 %.  (if some vitals are omitted, this means that patient was UNABLE to obtain them even though they were asked to get them prior to OV today.  They were asked to call at their earliest convenience with these once obtained. ) General: A & O * 3; sounds in no acute distress; in usual state of health.  Respiratory: speaking in full sentences, no conversational dyspnea Psych: insight appears good, mood- appears full

## 2020-03-21 ENCOUNTER — Other Ambulatory Visit: Payer: Self-pay | Admitting: Endocrinology

## 2020-03-21 ENCOUNTER — Other Ambulatory Visit: Payer: Self-pay | Admitting: Physician Assistant

## 2020-03-21 DIAGNOSIS — E039 Hypothyroidism, unspecified: Secondary | ICD-10-CM

## 2020-05-07 ENCOUNTER — Other Ambulatory Visit: Payer: Self-pay | Admitting: Physician Assistant

## 2020-05-07 DIAGNOSIS — F909 Attention-deficit hyperactivity disorder, unspecified type: Secondary | ICD-10-CM

## 2020-05-07 NOTE — Telephone Encounter (Signed)
Patient would like a refill on vyvanse and uses [facility] on [address]. Thanks

## 2020-05-07 NOTE — Addendum Note (Signed)
Addended by: Stan Head on: 05/07/2020 01:05 PM   Modules accepted: Orders

## 2020-05-08 MED ORDER — VYVANSE 70 MG PO CAPS: 70.0000 mg | ORAL_CAPSULE | Freq: Every day | ORAL | 0 refills | Status: DC

## 2020-05-12 ENCOUNTER — Other Ambulatory Visit: Payer: Self-pay

## 2020-05-12 ENCOUNTER — Other Ambulatory Visit: Payer: 59

## 2020-05-12 DIAGNOSIS — E7800 Pure hypercholesterolemia, unspecified: Secondary | ICD-10-CM

## 2020-05-12 DIAGNOSIS — Z0000 Encounter for general adult medical examination without abnormal findings: Secondary | ICD-10-CM

## 2020-05-12 DIAGNOSIS — E039 Hypothyroidism, unspecified: Secondary | ICD-10-CM

## 2020-05-12 DIAGNOSIS — E559 Vitamin D deficiency, unspecified: Secondary | ICD-10-CM

## 2020-05-12 NOTE — Progress Notes (Signed)
Lab only orders 

## 2020-06-16 ENCOUNTER — Other Ambulatory Visit: Payer: Self-pay

## 2020-06-16 ENCOUNTER — Encounter: Payer: Self-pay | Admitting: Physician Assistant

## 2020-06-16 ENCOUNTER — Ambulatory Visit (INDEPENDENT_AMBULATORY_CARE_PROVIDER_SITE_OTHER): Payer: 59 | Admitting: Physician Assistant

## 2020-06-16 VITALS — BP 136/93 | HR 89 | Ht 65.0 in | Wt 199.7 lb

## 2020-06-16 DIAGNOSIS — R030 Elevated blood-pressure reading, without diagnosis of hypertension: Secondary | ICD-10-CM

## 2020-06-16 DIAGNOSIS — F909 Attention-deficit hyperactivity disorder, unspecified type: Secondary | ICD-10-CM | POA: Diagnosis not present

## 2020-06-16 DIAGNOSIS — R69 Illness, unspecified: Secondary | ICD-10-CM | POA: Diagnosis not present

## 2020-06-16 DIAGNOSIS — F32A Depression, unspecified: Secondary | ICD-10-CM | POA: Diagnosis not present

## 2020-06-16 DIAGNOSIS — E039 Hypothyroidism, unspecified: Secondary | ICD-10-CM

## 2020-06-16 MED ORDER — LISDEXAMFETAMINE DIMESYLATE 70 MG PO CAPS: 70.0000 mg | ORAL_CAPSULE | Freq: Every day | ORAL | 0 refills | Status: DC

## 2020-06-16 MED ORDER — LEVOTHYROXINE SODIUM 88 MCG PO TABS: 88.0000 ug | ORAL_TABLET | Freq: Every day | ORAL | 0 refills | Status: DC

## 2020-06-16 MED ORDER — BUSPIRONE HCL 10 MG PO TABS: 10.0000 mg | ORAL_TABLET | Freq: Three times a day (TID) | ORAL | 0 refills | Status: DC

## 2020-06-16 MED ORDER — VYVANSE 70 MG PO CAPS
70.0000 mg | ORAL_CAPSULE | Freq: Every day | ORAL | 0 refills | Status: DC
Start: 2020-07-14 — End: 2020-10-01

## 2020-06-16 MED ORDER — BUPROPION HCL ER (XL) 300 MG PO TB24: 300.0000 mg | ORAL_TABLET | Freq: Every day | ORAL | 0 refills | Status: DC

## 2020-06-16 NOTE — Patient Instructions (Signed)

## 2020-06-16 NOTE — Assessment & Plan Note (Signed)
-  Stable. -PDMP reviewed, no aberrancies noted. Provided refills. -Will continue to monitor. -BP mildly elevated today. Recommend ambulatory BP and pulse monitoring.

## 2020-06-16 NOTE — Assessment & Plan Note (Signed)
-  Last TSH 1.420, wnl. -Continue current medication regimen. -Rechecking thyroid labs with CPE.

## 2020-06-16 NOTE — Assessment & Plan Note (Signed)
-  PHQ-9 score of 0. -Continue current medication regimen. Provided refills. -Advised patient to let me know if she considers starting an SSRI. -Will continue to monitor.
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
## 2020-06-24 IMAGING — CT CT ABD-PELV W/ CM
1 of 3 series · 13 of 32 positions shown, 19 images · IV contrast (APPLIED)
Comparison: 01/10/2018 ultrasound.

CLINICAL DATA: Midline abdominal bulge. Evaluate for ventral
hernia. Irritable bowel syndrome. Cholecystectomy. Gastric bypass.

EXAM:
CT ABDOMEN AND PELVIS WITH CONTRAST
TECHNIQUE: Multidetector CT imaging of the abdomen and pelvis was performed
using the standard protocol following bolus administration of
intravenous contrast.
CONTRAST:  100mL ABNT70-UVV IOPAMIDOL (ABNT70-UVV) INJECTION 61%

[Series 2: abd/pelvis w/cm · axial · 0.95mm/px · z∈[-477,-37]mm · 13 of 102 slices shown, 19 images]
[im 7/102  soft-tissue]
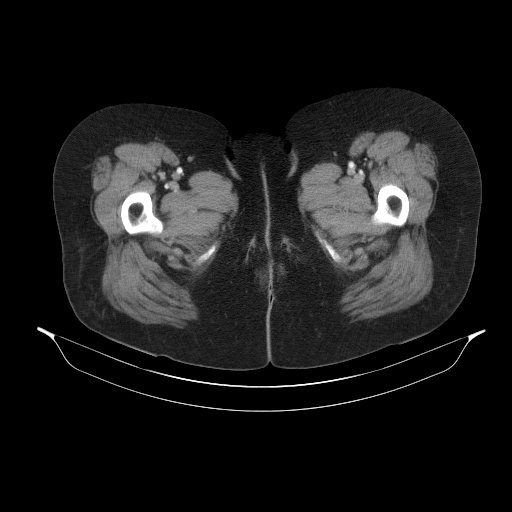
[im 7/102  bone]
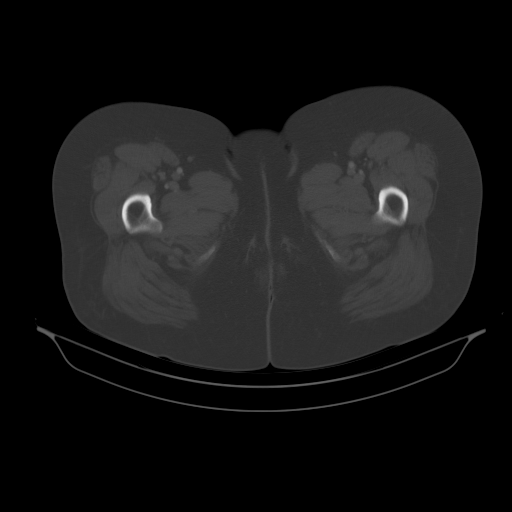
[im 14/102  soft-tissue]
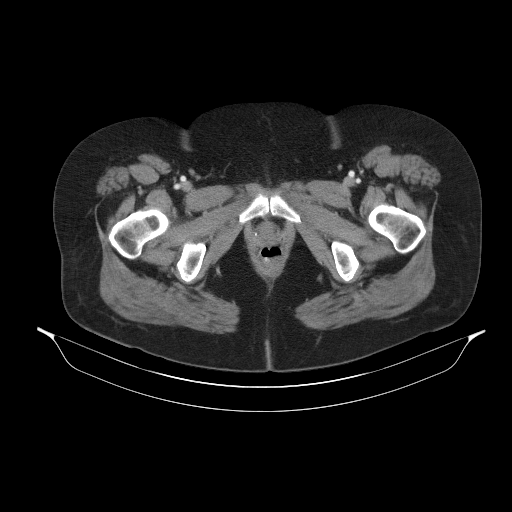
[im 21/102  soft-tissue]
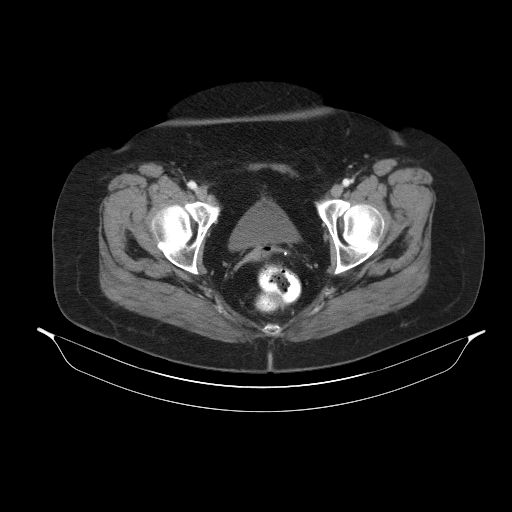
[im 27/102  soft-tissue]
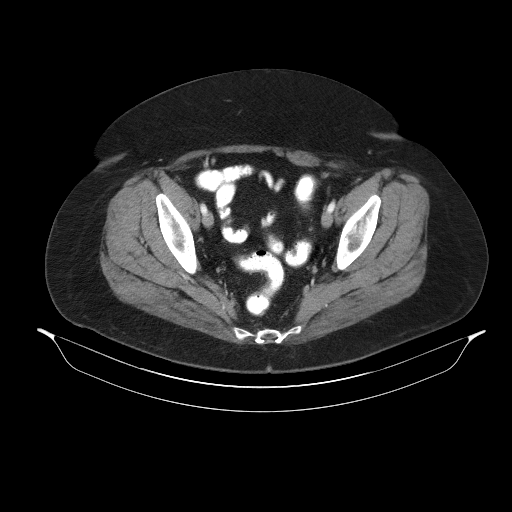
[im 34/102  soft-tissue]
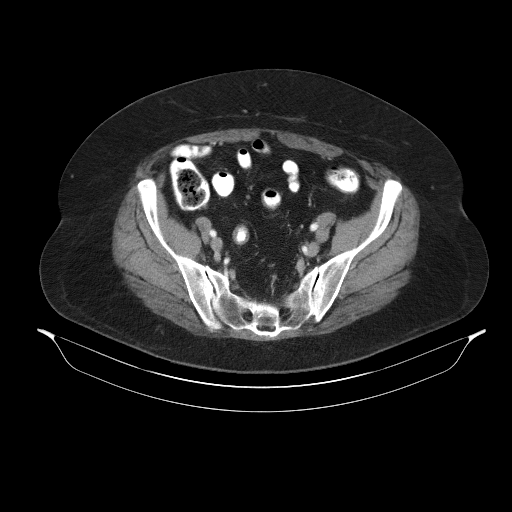
[im 41/102  soft-tissue]
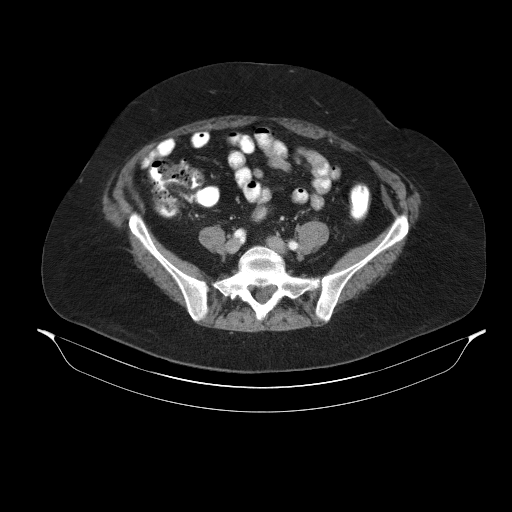
[im 54/102  soft-tissue]
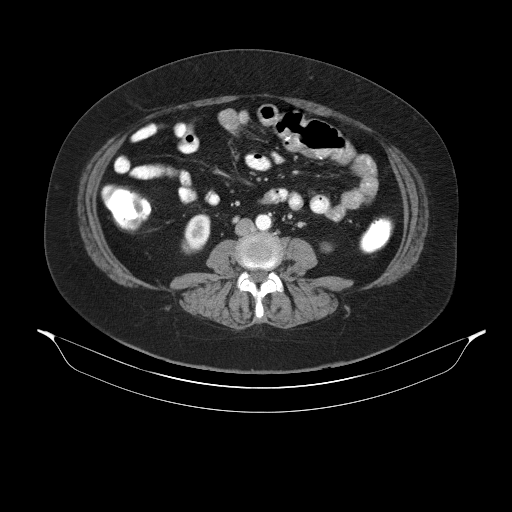
[im 61/102  soft-tissue]
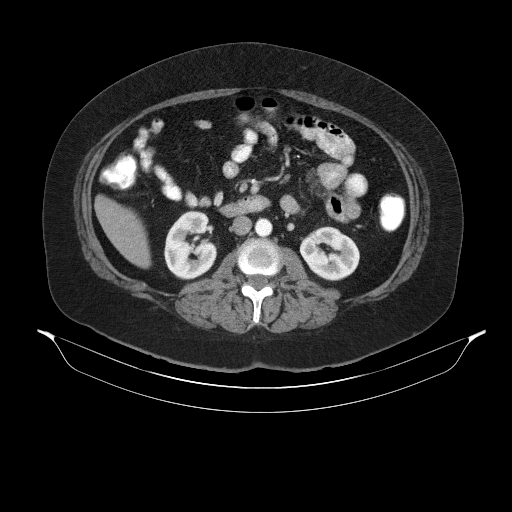
[im 68/102  soft-tissue]
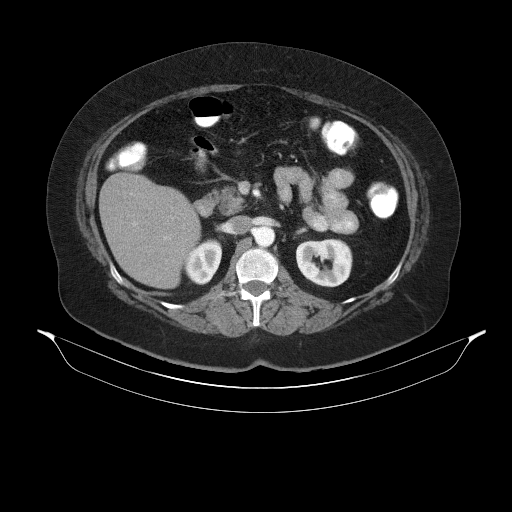
[im 68/102  bone]
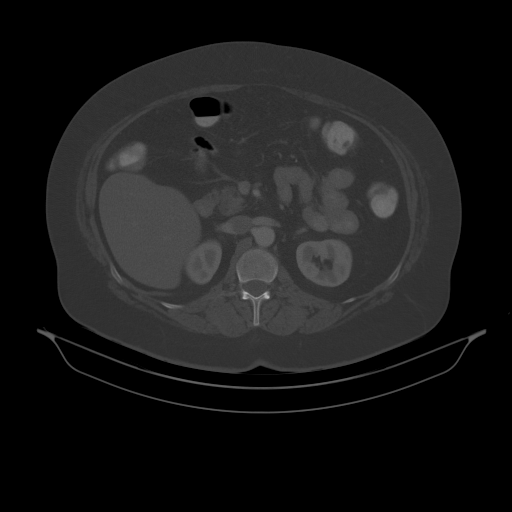
[im 75/102  soft-tissue]
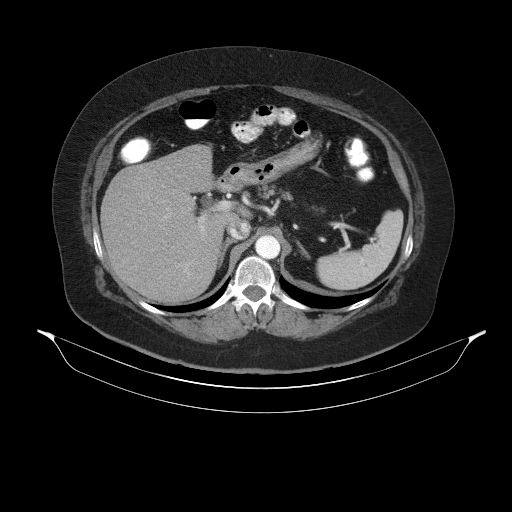
[im 75/102  lung]
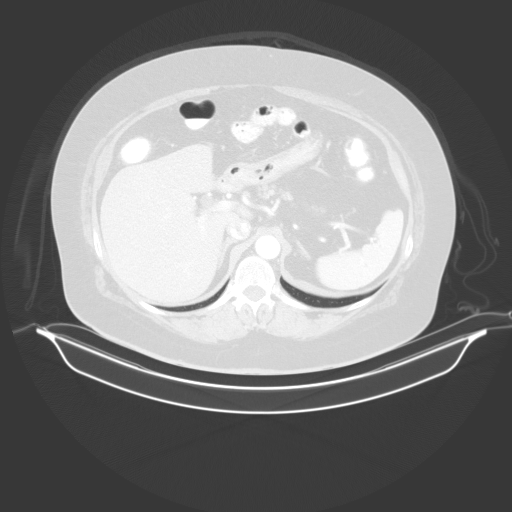
[im 81/102  soft-tissue]
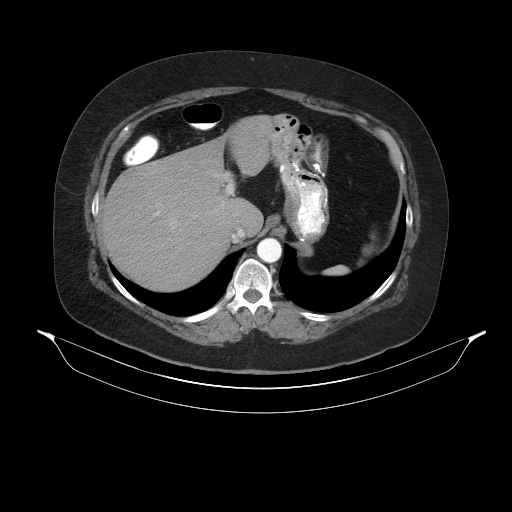
[im 81/102  lung]
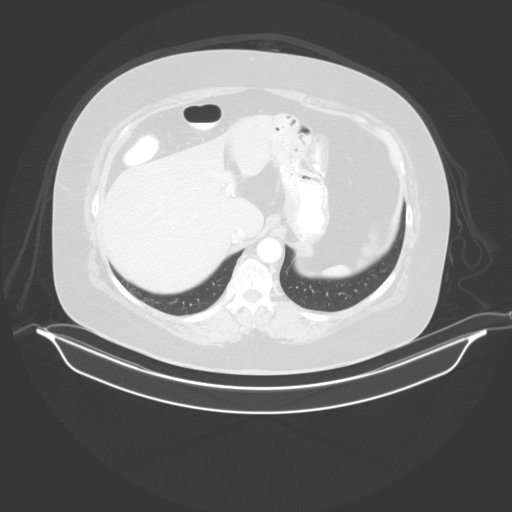
[im 88/102  soft-tissue]
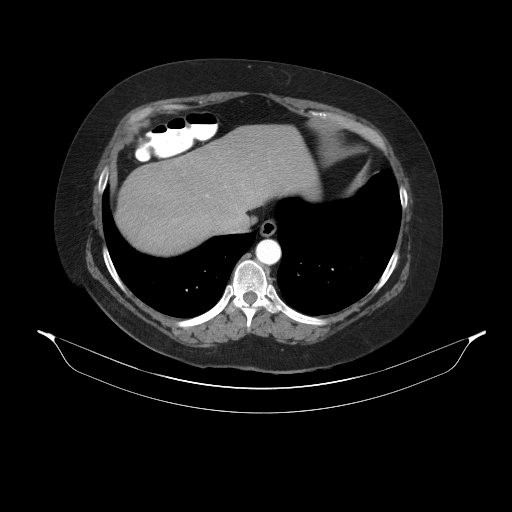
[im 88/102  lung]
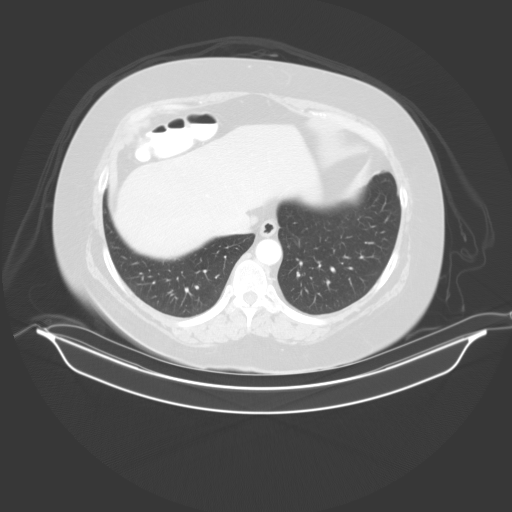
[im 95/102  soft-tissue]
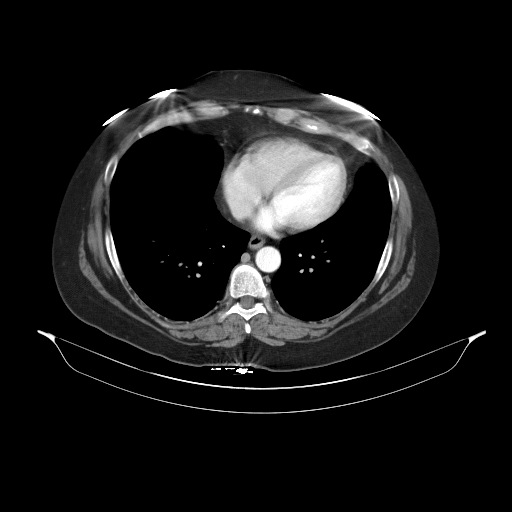
[im 95/102  lung]
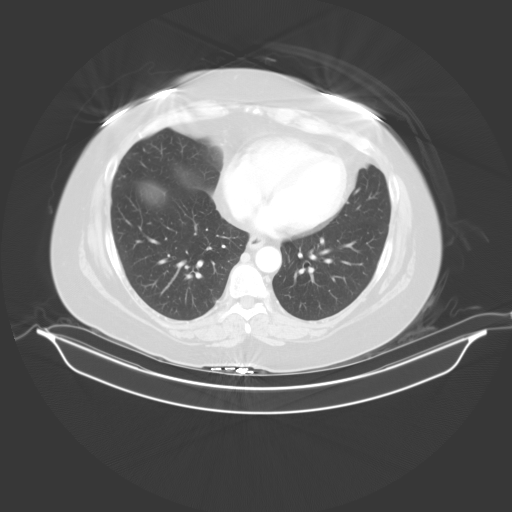

[13 of 32 positions shown; findings below may reference images not displayed]

FINDINGS: Lower chest: Clear lung bases. Normal heart size without pericardial
or pleural effusion.

Hepatobiliary: Normal liver. Cholecystectomy, without biliary ductal
dilatation.

Pancreas: Normal, without mass or ductal dilatation.

Spleen: Normal in size, without focal abnormality.

Adrenals/Urinary Tract: Normal adrenal glands. Normal right kidney.
Upper pole left renal too small to characterize lesion, likely a
cyst. Normal urinary bladder.

Stomach/Bowel: Gastrojejunostomy. Normal colon, appendix, and
terminal ileum. Otherwise normal small bowel.

Vascular/Lymphatic: Aortic atherosclerosis. No abdominopelvic
adenopathy.

Reproductive: Normal uterus and adnexa.

Other: No significant free fluid. Fat containing right paramidline
ventral abdominal wall hernia including on image 38/2. The abdominal
wall defect measures on the order of 1.6 cm.

Musculoskeletal: No acute osseous abnormality.
IMPRESSION: 1. Fat containing ventral abdominal wall hernia. No bowel
involvement.
2.  No acute process in the abdomen or pelvis.
3.  Aortic Atherosclerosis ([id number]-X2X.X).

## 2020-07-09 ENCOUNTER — Other Ambulatory Visit: Payer: Self-pay

## 2020-07-09 ENCOUNTER — Other Ambulatory Visit: Payer: 59

## 2020-07-09 ENCOUNTER — Telehealth: Payer: Self-pay | Admitting: Physician Assistant

## 2020-07-09 DIAGNOSIS — R300 Dysuria: Secondary | ICD-10-CM

## 2020-07-09 NOTE — Telephone Encounter (Signed)
Patient states she has burning when urinating and frequent urination. Please advise, thanks.

## 2020-07-09 NOTE — Telephone Encounter (Signed)
Patient coming to give urine sample today at 445pm. Please add pt to lab schedule. AS, CMA

## 2020-07-10 ENCOUNTER — Ambulatory Visit (INDEPENDENT_AMBULATORY_CARE_PROVIDER_SITE_OTHER): Payer: 59 | Admitting: Physician Assistant

## 2020-07-10 ENCOUNTER — Encounter: Payer: Self-pay | Admitting: Physician Assistant

## 2020-07-10 VITALS — BP 149/85 | HR 99 | Wt 199.0 lb

## 2020-07-10 DIAGNOSIS — N3090 Cystitis, unspecified without hematuria: Secondary | ICD-10-CM | POA: Diagnosis not present

## 2020-07-10 DIAGNOSIS — R300 Dysuria: Secondary | ICD-10-CM

## 2020-07-10 MED ORDER — CEFTRIAXONE SODIUM 1 G IJ SOLR
1.0000 g | Freq: Once | INTRAMUSCULAR | Status: AC
  Administered 2020-07-10: 1 g via INTRAMUSCULAR

## 2020-07-10 NOTE — Progress Notes (Signed)
Patient given 1G Rocephin per [facility] for Cystitis. Patient tolerated injection well. AS, CMA

## 2020-07-10 NOTE — Patient Instructions (Signed)
Urinary Tract Infection, Adult  A urinary tract infection (UTI) is an infection of any part of the urinary tract. The urinary tract includes the kidneys, ureters, bladder, and urethra. These organs make, store, and get rid of urine in the body. An upper UTI affects the ureters and kidneys. A lower UTI affects the bladder and urethra. What are the causes? Most urinary tract infections are caused by bacteria in your genital area around your urethra, where urine leaves your body. These bacteria grow and cause inflammation of your urinary tract. What increases the risk? You are more likely to develop this condition if:  You have a urinary catheter that stays in place.  You are not able to control when you urinate or have a bowel movement (incontinence).  You are female and you: ? Use a spermicide or diaphragm for birth control. ? Have low estrogen levels. ? Are pregnant.  You have certain genes that increase your risk.  You are sexually active.  You take antibiotic medicines.  You have a condition that causes your flow of urine to slow down, such as: ? An enlarged prostate, if you are female. ? Blockage in your urethra. ? A kidney stone. ? A nerve condition that affects your bladder control (neurogenic bladder). ? Not getting enough to drink, or not urinating often.  You have certain medical conditions, such as: ? Diabetes. ? A weak disease-fighting system (immunesystem). ? Sickle cell disease. ? Gout. ? Spinal cord injury. What are the signs or symptoms? Symptoms of this condition include:  Needing to urinate right away (urgency).  Frequent urination. This may include small amounts of urine each time you urinate.  Pain or burning with urination.  Blood in the urine.  Urine that smells bad or unusual.  Trouble urinating.  Cloudy urine.  Vaginal discharge, if you are female.  Pain in the abdomen or the lower back. You may also have:  Vomiting or a decreased  appetite.  Confusion.  Irritability or tiredness.  A fever or chills.  Diarrhea. The first symptom in older adults may be confusion. In some cases, they may not have any symptoms until the infection has worsened. How is this diagnosed? This condition is diagnosed based on your medical history and a physical exam. You may also have other tests, including:  Urine tests.  Blood tests.  Tests for STIs (sexually transmitted infections). If you have had more than one UTI, a cystoscopy or imaging studies may be done to determine the cause of the infections. How is this treated? Treatment for this condition includes:  Antibiotic medicine.  Over-the-counter medicines to treat discomfort.  Drinking enough water to stay hydrated. If you have frequent infections or have other conditions such as a kidney stone, you may need to see a health care provider who specializes in the urinary tract (urologist). In rare cases, urinary tract infections can cause sepsis. Sepsis is a life-threatening condition that occurs when the body responds to an infection. Sepsis is treated in the hospital with IV antibiotics, fluids, and other medicines. Follow these instructions at home: Medicines  Take over-the-counter and prescription medicines only as told by your health care provider.  If you were prescribed an antibiotic medicine, take it as told by your health care provider. Do not stop using the antibiotic even if you start to feel better. General instructions  Make sure you: ? Empty your bladder often and completely. Do not hold urine for long periods of time. ? Empty your bladder after   sex. ? Wipe from front to back after urinating or having a bowel movement if you are female. Use each tissue only one time when you wipe.  Drink enough fluid to keep your urine pale yellow.  Keep all follow-up visits. This is important.   Contact a health care provider if:  Your symptoms do not get better after 1-2  days.  Your symptoms go away and then return. Get help right away if:  You have severe pain in your back or your lower abdomen.  You have a fever or chills.  You have nausea or vomiting. Summary  A urinary tract infection (UTI) is an infection of any part of the urinary tract, which includes the kidneys, ureters, bladder, and urethra.  Most urinary tract infections are caused by bacteria in your genital area.  Treatment for this condition often includes antibiotic medicines.  If you were prescribed an antibiotic medicine, take it as told by your health care provider. Do not stop using the antibiotic even if you start to feel better.  Keep all follow-up visits. This is important. This information is not intended to replace advice given to you by your health care provider. Make sure you discuss any questions you have with your health care provider. Document Revised: 12/14/2019 Document Reviewed: 12/14/2019 Elsevier Patient Education  2021 Elsevier Inc.  

## 2020-07-11 LAB — URINE CULTURE

## 2020-07-14 MED ORDER — NITROFURANTOIN MONOHYD MACRO 100 MG PO CAPS: 100.0000 mg | ORAL_CAPSULE | Freq: Two times a day (BID) | ORAL | 0 refills | Status: DC

## 2020-07-14 NOTE — Addendum Note (Signed)
Addended by: Mayer Masker on: 07/14/2020 05:03 PM   Modules accepted: Orders

## 2020-07-16 ENCOUNTER — Other Ambulatory Visit: Payer: Self-pay | Admitting: Physician Assistant

## 2020-07-16 DIAGNOSIS — F32A Depression, unspecified: Secondary | ICD-10-CM

## 2020-08-13 ENCOUNTER — Other Ambulatory Visit: Payer: Self-pay | Admitting: Physician Assistant

## 2020-08-13 DIAGNOSIS — F32A Depression, unspecified: Secondary | ICD-10-CM

## 2020-08-20 ENCOUNTER — Encounter: Payer: Self-pay | Admitting: Physician Assistant

## 2020-09-12 ENCOUNTER — Other Ambulatory Visit: Payer: Self-pay

## 2020-09-12 ENCOUNTER — Other Ambulatory Visit: Payer: Managed Care, Other (non HMO)

## 2020-09-12 DIAGNOSIS — Z0000 Encounter for general adult medical examination without abnormal findings: Secondary | ICD-10-CM

## 2020-09-12 DIAGNOSIS — E559 Vitamin D deficiency, unspecified: Secondary | ICD-10-CM

## 2020-09-12 DIAGNOSIS — E7800 Pure hypercholesterolemia, unspecified: Secondary | ICD-10-CM

## 2020-09-12 DIAGNOSIS — E039 Hypothyroidism, unspecified: Secondary | ICD-10-CM

## 2020-09-15 ENCOUNTER — Encounter: Payer: 59 | Admitting: Physician Assistant

## 2020-09-17 ENCOUNTER — Other Ambulatory Visit: Payer: Self-pay | Admitting: Physician Assistant

## 2020-09-17 DIAGNOSIS — E039 Hypothyroidism, unspecified: Secondary | ICD-10-CM

## 2020-09-18 ENCOUNTER — Other Ambulatory Visit: Payer: Self-pay | Admitting: Physician Assistant

## 2020-09-18 DIAGNOSIS — F32A Depression, unspecified: Secondary | ICD-10-CM

## 2020-09-19 ENCOUNTER — Other Ambulatory Visit: Payer: Self-pay | Admitting: Physician Assistant

## 2020-09-19 DIAGNOSIS — F909 Attention-deficit hyperactivity disorder, unspecified type: Secondary | ICD-10-CM

## 2020-09-22 MED ORDER — LISDEXAMFETAMINE DIMESYLATE 70 MG PO CAPS: 70.0000 mg | ORAL_CAPSULE | Freq: Every day | ORAL | 0 refills | Status: DC

## 2020-09-22 NOTE — Telephone Encounter (Signed)
Patient last seen 06/16/20 and advised to follow up in 3 months.   Last refill given 08/11/20 for 30 day supply.   Pt has CPE scheduled for 10/01/20

## 2020-09-25 ENCOUNTER — Other Ambulatory Visit: Payer: Self-pay

## 2020-09-26 LAB — CBC WITH DIFFERENTIAL/PLATELET
Basophils Absolute: 0.1 10*3/uL (ref 0.0–0.2)
Basos: 1 %
EOS (ABSOLUTE): 0.4 10*3/uL (ref 0.0–0.4)
Eos: 6 %
Hematocrit: 45.1 % (ref 34.0–46.6)
Hemoglobin: 15.2 g/dL (ref 11.1–15.9)
Immature Grans (Abs): 0 10*3/uL (ref 0.0–0.1)
Immature Granulocytes: 0 %
Lymphocytes Absolute: 1.3 10*3/uL (ref 0.7–3.1)
Lymphs: 22 %
MCH: 30.8 pg (ref 26.6–33.0)
MCHC: 33.7 g/dL (ref 31.5–35.7)
MCV: 92 fL (ref 79–97)
Monocytes Absolute: 0.6 10*3/uL (ref 0.1–0.9)
Monocytes: 10 %
Neutrophils Absolute: 3.7 10*3/uL (ref 1.4–7.0)
Neutrophils: 61 %
Platelets: 327 10*3/uL (ref 150–450)
RBC: 4.93 x10E6/uL (ref 3.77–5.28)
RDW: 13.7 % (ref 11.7–15.4)
WBC: 6 10*3/uL (ref 3.4–10.8)

## 2020-09-26 LAB — CMP14+EGFR
ALT: 23 IU/L (ref 0–32)
AST: 25 IU/L (ref 0–40)
Albumin/Globulin Ratio: 1.6 (ref 1.2–2.2)
Albumin: 4.4 g/dL (ref 3.8–4.9)
Alkaline Phosphatase: 96 IU/L (ref 44–121)
BUN/Creatinine Ratio: 19 (ref 9–23)
BUN: 15 mg/dL (ref 6–24)
Bilirubin Total: <0.2 mg/dL (ref 0.0–1.2)
CO2: 21 mmol/L (ref 20–29)
Calcium: 8.9 mg/dL (ref 8.7–10.2)
Chloride: 106 mmol/L (ref 96–106)
Creatinine, Ser: 0.8 mg/dL (ref 0.57–1.00)
Globulin, Total: 2.7 g/dL (ref 1.5–4.5)
Glucose: 103 mg/dL — ABNORMAL HIGH (ref 65–99)
Potassium: 4.7 mmol/L (ref 3.5–5.2)
Sodium: 146 mmol/L — ABNORMAL HIGH (ref 134–144)
Total Protein: 7.1 g/dL (ref 6.0–8.5)
eGFR: 89 mL/min/{1.73_m2} (ref >59)

## 2020-09-26 LAB — TSH: TSH: 1.69 u[IU]/mL (ref 0.450–4.500)

## 2020-09-26 LAB — VITAMIN D 1,25 DIHYDROXY
Vitamin D 1, 25 (OH)2 Total: 57 pg/mL
Vitamin D2 1, 25 (OH)2: 41 pg/mL
Vitamin D3 1, 25 (OH)2: 16 pg/mL

## 2020-09-26 LAB — LIPID PANEL W/O CHOL/HDL RATIO
Cholesterol, Total: 293 mg/dL — ABNORMAL HIGH (ref 100–199)
HDL: 64 mg/dL (ref >39)
LDL Chol Calc (NIH): 198 mg/dL — ABNORMAL HIGH (ref 0–99)
Triglycerides: 165 mg/dL — ABNORMAL HIGH (ref 0–149)
VLDL Cholesterol Cal: 31 mg/dL (ref 5–40)

## 2020-10-01 ENCOUNTER — Other Ambulatory Visit: Payer: Self-pay

## 2020-10-01 ENCOUNTER — Encounter: Payer: Self-pay | Admitting: Physician Assistant

## 2020-10-01 ENCOUNTER — Ambulatory Visit (INDEPENDENT_AMBULATORY_CARE_PROVIDER_SITE_OTHER): Payer: Managed Care, Other (non HMO) | Admitting: Physician Assistant

## 2020-10-01 VITALS — BP 122/85 | HR 76 | Temp 99.1°F | Ht 65.0 in | Wt 198.8 lb

## 2020-10-01 DIAGNOSIS — E7800 Pure hypercholesterolemia, unspecified: Secondary | ICD-10-CM

## 2020-10-01 DIAGNOSIS — Z0000 Encounter for general adult medical examination without abnormal findings: Secondary | ICD-10-CM

## 2020-10-01 DIAGNOSIS — F909 Attention-deficit hyperactivity disorder, unspecified type: Secondary | ICD-10-CM | POA: Diagnosis not present

## 2020-10-01 MED ORDER — LISDEXAMFETAMINE DIMESYLATE 70 MG PO CAPS: 70.0000 mg | ORAL_CAPSULE | Freq: Every day | ORAL | 0 refills | Status: DC

## 2020-10-01 MED ORDER — VYVANSE 70 MG PO CAPS: 70.0000 mg | ORAL_CAPSULE | Freq: Every day | ORAL | 0 refills | Status: DC

## 2020-10-01 NOTE — Progress Notes (Signed)
Subjective:     Laurie Arnold is a 55 y.o. female and is here for a comprehensive physical exam. The patient reports problems - intermittent palpitations.  Social History   Socioeconomic History  . Marital status: Divorced    Spouse name: Not on file  . Number of children: Not on file  . Years of education: Not on file  . Highest education level: Not on file  Occupational History  . Not on file  Tobacco Use  . Smoking status: Former Smoker    Packs/day: 0.25    Years: 7.00    Pack years: 1.75    Types: Cigarettes    Quit date: 05/17/1993    Years since quitting: 27.3  . Smokeless tobacco: Never Used  Vaping Use  . Vaping Use: Never used  Substance and Sexual Activity  . Alcohol use: Yes    Comment: rare glass of wine   . Drug use: Never  . Sexual activity: Not Currently    Birth control/protection: Surgical  Other Topics Concern  . Not on file  Social History Narrative  . Not on file   Social Determinants of Health   Financial Resource Strain: Not on file  Food Insecurity: Not on file  Transportation Needs: Not on file  Physical Activity: Not on file  Stress: Not on file  Social Connections: Not on file  Intimate Partner Violence: Not on file   Health Maintenance  Topic Date Due  . COVID-19 Vaccine (1) Never done  . HIV Screening  Never done  . Hepatitis C Screening  Never done  . MAMMOGRAM  Never done  . TETANUS/TDAP  10/15/2020  . INFLUENZA VACCINE  12/15/2020  . COLONOSCOPY (Pts 45-69yrs Insurance coverage will need to be confirmed)  12/03/2029  . HPV VACCINES  Aged Out    The following portions of the patient's history were reviewed and updated as appropriate: allergies, current medications, past family history, past medical history, past social history, past surgical history and problem list.  Review of Systems Pertinent items noted in HPI and remainder of comprehensive ROS otherwise negative.   Objective:    BP 122/85   Pulse 76   Temp 99.1 F  (37.3 C)   Ht 5\' 5"  (1.651 m)   Wt 198 lb 12.8 oz (90.2 kg)   SpO2 98%   BMI 33.08 kg/m  General appearance: alert, cooperative and no distress Head: Normocephalic, without obvious abnormality, atraumatic Eyes: conjunctivae/corneas clear. PERRL, EOM's intact. Fundi benign. Ears: normal TM's and external ear canals both ears Nose: Nares normal. Septum midline. Mucosa normal. No drainage or sinus tenderness. Throat: lips, mucosa, and tongue normal; teeth and gums normal Neck: no adenopathy, no JVD, supple, symmetrical, trachea midline and thyroid: normal to inspection and palpation Back: symmetric, no curvature. ROM normal. No CVA tenderness. Lungs: clear to auscultation bilaterally Breasts: Deferred by pt. Heart: regular rate and rhythm, S1, S2 normal, no murmur, click, rub or gallop Abdomen: soft, non-tender; bowel sounds normal; no masses,  no organomegaly Pelvic: deferred by pt Extremities: extremities normal, atraumatic, no cyanosis or edema Pulses: 2+ and symmetric Skin: Skin color, texture, turgor normal. No rashes or lesions Lymph nodes: Cervical adenopathy: normal and Supraclavicular adenopathy: normal Neurologic: Grossly normal    Assessment:    Healthy female exam.     Plan:  -Patient deferred mammogram, reports she will call and schedule. Deferred pelvic exam including pap smear. -UTD on colonoscopy, Tdap. Declined Shingrix and Hep C screenings at this time. -Discussed most  recent lab results, most are essentially within normal limits or stable from prior with the exception of elevated lipid panel. Recommend to continue with dietary changes and follow a heart healthy diet. Recommend repeating lipid panel in 6 months as well as A1c. -Continue current medication regimen. Provided future refills for Vyvanse. Not due for other med refills at this time. -Patient prefers to monitor symptoms.Recommend Holter monitor if palpitations worsen or fail to improve. -Follow up in 3  months for ADHD, Mood   See After Visit Summary for Counseling Recommendations

## 2020-10-01 NOTE — Patient Instructions (Signed)
Preventive Care 84-53 Years Old, Female Preventive care refers to lifestyle choices and visits with your health care provider that can promote health and wellness. This includes:  A yearly physical exam. This is also called an annual wellness visit.  Regular dental and eye exams.  Immunizations.  Screening for certain conditions.  Healthy lifestyle choices, such as: ? Eating a healthy diet. ? Getting regular exercise. ? Not using drugs or products that contain nicotine and tobacco. ? Limiting alcohol use. What can I expect for my preventive care visit? Physical exam Your health care provider will check your:  Height and weight. These may be used to calculate your BMI (body mass index). BMI is a measurement that tells if you are at a healthy weight.  Heart rate and blood pressure.  Body temperature.  Skin for abnormal spots. Counseling Your health care provider may ask you questions about your:  Past medical problems.  Family's medical history.  Alcohol, tobacco, and drug use.  Emotional well-being.  Home life and relationship well-being.  Sexual activity.  Diet, exercise, and sleep habits.  Work and work Statistician.  Access to firearms.  Method of birth control.  Menstrual cycle.  Pregnancy history. What immunizations do I need? Vaccines are usually given at various ages, according to a schedule. Your health care provider will recommend vaccines for you based on your age, medical history, and lifestyle or other factors, such as travel or where you work.   What tests do I need? Blood tests  Lipid and cholesterol levels. These may be checked every 5 years, or more often if you are over 53 years old.  Hepatitis C test.  Hepatitis B test. Screening  Lung cancer screening. You may have this screening every year starting at age 53 if you have a 30-pack-year history of smoking and currently smoke or have quit within the past 15 years.  Colorectal cancer  screening. ? All adults should have this screening starting at age 53 and continuing until age 17. ? Your health care provider may recommend screening at age 49 if you are at increased risk. ? You will have tests every 1-10 years, depending on your results and the type of screening test.  Diabetes screening. ? This is done by checking your blood sugar (glucose) after you have not eaten for a while (fasting). ? You may have this done every 1-3 years.  Mammogram. ? This may be done every 1-2 years. ? Talk with your health care provider about when you should start having regular mammograms. This may depend on whether you have a family history of breast cancer.  BRCA-related cancer screening. This may be done if you have a family history of breast, ovarian, tubal, or peritoneal cancers.  Pelvic exam and Pap test. ? This may be done every 3 years starting at age 53. ? Starting at age 11, this may be done every 5 years if you have a Pap test in combination with an HPV test. Other tests  STD (sexually transmitted disease) testing, if you are at risk.  Bone density scan. This is done to screen for osteoporosis. You may have this scan if you are at high risk for osteoporosis. Talk with your health care provider about your test results, treatment options, and if necessary, the need for more tests. Follow these instructions at home: Eating and drinking  Eat a diet that includes fresh fruits and vegetables, whole grains, lean protein, and low-fat dairy products.  Take vitamin and mineral supplements  as recommended by your health care provider.  Do not drink alcohol if: ? Your health care provider tells you not to drink. ? You are pregnant, may be pregnant, or are planning to become pregnant.  If you drink alcohol: ? Limit how much you have to 0-1 drink a day. ? Be aware of how much alcohol is in your drink. In the [place] one drink equals one 12 oz bottle of beer (355 mL), one 5 oz glass of  wine (148 mL), or one 1 oz glass of hard liquor (44 mL).   Lifestyle  Take daily care of your teeth and gums. Brush your teeth every morning and night with fluoride toothpaste. Floss one time each day.  Stay active. Exercise for at least 30 minutes 5 or more days each week.  Do not use any products that contain nicotine or tobacco, such as cigarettes, e-cigarettes, and chewing tobacco. If you need help quitting, ask your health care provider.  Do not use drugs.  If you are sexually active, practice safe sex. Use a condom or other form of protection to prevent STIs (sexually transmitted infections).  If you do not wish to become pregnant, use a form of birth control. If you plan to become pregnant, see your health care provider for a prepregnancy visit.  If told by your health care provider, take low-dose aspirin daily starting at age 53.  Find healthy ways to cope with stress, such as: ? Meditation, yoga, or listening to music. ? Journaling. ? Talking to a trusted person. ? Spending time with friends and family. Safety  Always wear your seat belt while driving or riding in a vehicle.  Do not drive: ? If you have been drinking alcohol. Do not ride with someone who has been drinking. ? When you are tired or distracted. ? While texting.  Wear a helmet and other protective equipment during sports activities.  If you have firearms in your house, make sure you follow all gun safety procedures. What's next?  Visit your health care provider once a year for an annual wellness visit.  Ask your health care provider how often you should have your eyes and teeth checked.  Stay up to date on all vaccines. This information is not intended to replace advice given to you by your health care provider. Make sure you discuss any questions you have with your health care provider. Document Revised: 02/05/2020 Document Reviewed: 01/12/2018 Elsevier Patient Education  2021 Elsevier Inc.  

## 2020-11-04 ENCOUNTER — Other Ambulatory Visit: Payer: Self-pay | Admitting: Nurse Practitioner

## 2020-11-04 ENCOUNTER — Encounter: Payer: Self-pay | Admitting: Physician Assistant

## 2020-11-04 DIAGNOSIS — K645 Perianal venous thrombosis: Secondary | ICD-10-CM

## 2020-11-04 MED ORDER — HYDROCORTISONE ACETATE 25 MG RE SUPP: 25.0000 mg | Freq: Two times a day (BID) | RECTAL | 2 refills | Status: DC

## 2020-11-04 MED ORDER — HYDROCORTISONE (PERIANAL) 2.5 % EX CREA
1.0000 | TOPICAL_CREAM | Freq: Two times a day (BID) | CUTANEOUS | 1 refills | Status: DC
Start: 2020-11-04 — End: 2022-10-12

## 2020-11-04 NOTE — Progress Notes (Signed)
Sent Snusol HC cream to [facility] for thrombosed hemorrhoid.

## 2020-11-04 NOTE — Progress Notes (Signed)
Sent prescription for suppositories as weil.

## 2020-12-19 ENCOUNTER — Other Ambulatory Visit: Payer: Self-pay | Admitting: Physician Assistant

## 2020-12-19 DIAGNOSIS — E039 Hypothyroidism, unspecified: Secondary | ICD-10-CM

## 2020-12-19 DIAGNOSIS — F32A Depression, unspecified: Secondary | ICD-10-CM

## 2021-01-01 ENCOUNTER — Encounter: Payer: Self-pay | Admitting: Physician Assistant

## 2021-01-01 ENCOUNTER — Other Ambulatory Visit: Payer: Self-pay

## 2021-01-01 ENCOUNTER — Ambulatory Visit (INDEPENDENT_AMBULATORY_CARE_PROVIDER_SITE_OTHER): Payer: Managed Care, Other (non HMO) | Admitting: Physician Assistant

## 2021-01-01 VITALS — BP 139/84 | HR 87 | Temp 97.6°F | Ht 65.0 in | Wt 193.9 lb

## 2021-01-01 DIAGNOSIS — E039 Hypothyroidism, unspecified: Secondary | ICD-10-CM | POA: Diagnosis not present

## 2021-01-01 DIAGNOSIS — E669 Obesity, unspecified: Secondary | ICD-10-CM

## 2021-01-01 DIAGNOSIS — F909 Attention-deficit hyperactivity disorder, unspecified type: Secondary | ICD-10-CM

## 2021-01-01 DIAGNOSIS — F32A Depression, unspecified: Secondary | ICD-10-CM | POA: Diagnosis not present

## 2021-01-01 DIAGNOSIS — Z6832 Body mass index (BMI) 32.0-32.9, adult: Secondary | ICD-10-CM

## 2021-01-01 DIAGNOSIS — G629 Polyneuropathy, unspecified: Secondary | ICD-10-CM

## 2021-01-01 MED ORDER — BUPROPION HCL ER (XL) 300 MG PO TB24: 300.0000 mg | ORAL_TABLET | Freq: Every day | ORAL | 1 refills | Status: DC

## 2021-01-01 MED ORDER — OZEMPIC (0.25 OR 0.5 MG/DOSE) 2 MG/1.5ML ~~LOC~~ SOPN: 0.5000 mg | PEN_INJECTOR | SUBCUTANEOUS | 0 refills | Status: DC

## 2021-01-01 MED ORDER — GABAPENTIN 300 MG PO CAPS: 300.0000 mg | ORAL_CAPSULE | Freq: Every day | ORAL | 0 refills | Status: DC

## 2021-01-01 MED ORDER — BUSPIRONE HCL 10 MG PO TABS: 10.0000 mg | ORAL_TABLET | Freq: Three times a day (TID) | ORAL | 1 refills | Status: DC

## 2021-01-01 MED ORDER — LEVOTHYROXINE SODIUM 88 MCG PO TABS: 88.0000 ug | ORAL_TABLET | Freq: Every day | ORAL | 1 refills | Status: DC

## 2021-01-01 MED ORDER — LISDEXAMFETAMINE DIMESYLATE 70 MG PO CAPS: 70.0000 mg | ORAL_CAPSULE | Freq: Every day | ORAL | 0 refills | Status: DC

## 2021-01-01 MED ORDER — VYVANSE 70 MG PO CAPS: 70.0000 mg | ORAL_CAPSULE | Freq: Every day | ORAL | 0 refills | Status: DC

## 2021-01-01 NOTE — Assessment & Plan Note (Signed)
-  Associated with hyperlipidemia. -Patient has lost 5 pounds since last visit. Discussed management options including medication and wants to trial Ozempic (no hx of MEN/MTC). Provided sample. Advised to continue with dietary changes. -Follow up in 4 weeks.

## 2021-01-01 NOTE — Assessment & Plan Note (Addendum)
>>  ASSESSMENT AND PLAN FOR OVERWEIGHT WITH BODY MASS INDEX (BMI) OF 26 TO 26.9 IN ADULT WRITTEN ON 01/01/2021  4:47 PM BY Shealee Yordy, PA-C  -Stable. -Continue current medication regimen. Patient has been same regimen for several years. -BP and pulse stable. -Will continue to monitor.  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 -Patient has lost 5 pounds since last visit. Discussed management options including medication and wants to trial Ozempic (no hx of MEN/MTC). Provided sample. Advised to continue with dietary changes. -Follow up in 4 weeks.
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
## 2021-01-01 NOTE — Patient Instructions (Signed)

## 2021-01-01 NOTE — Assessment & Plan Note (Signed)
-  Last TSH wnl -Continue current medication regimen. -Will continue to monitor.  

## 2021-01-01 NOTE — Assessment & Plan Note (Signed)
-  PHQ-9 score of 0, stable. -Continue current medication regimen. Provided refills. -Will continue to monitor.

## 2021-01-02 ENCOUNTER — Telehealth: Payer: Self-pay | Admitting: Physician Assistant

## 2021-01-02 DIAGNOSIS — R110 Nausea: Secondary | ICD-10-CM

## 2021-01-02 MED ORDER — ONDANSETRON HCL 4 MG PO TABS: 4.0000 mg | ORAL_TABLET | Freq: Three times a day (TID) | ORAL | 0 refills | Status: DC | PRN

## 2021-01-02 NOTE — Addendum Note (Signed)
Addended by: Sylvester Harder on: 01/02/2021 09:41 AM   Modules accepted: Orders

## 2021-01-02 NOTE — Telephone Encounter (Signed)
Per Kandis Cocking sending in Zofran 4mg . Please contact patient to advise. AS, CMA

## 2021-01-02 NOTE — Telephone Encounter (Signed)
Patient would like some Zofran called in for her ozempic in case nausea occurs when she starts it tomorrow. Patient uses [facility] on [address], thanks.

## 2021-01-28 ENCOUNTER — Ambulatory Visit: Payer: Managed Care, Other (non HMO) | Admitting: Physician Assistant

## 2021-03-25 ENCOUNTER — Other Ambulatory Visit: Payer: Self-pay | Admitting: Physician Assistant

## 2021-03-25 DIAGNOSIS — F909 Attention-deficit hyperactivity disorder, unspecified type: Secondary | ICD-10-CM

## 2021-03-25 MED ORDER — LISDEXAMFETAMINE DIMESYLATE 70 MG PO CAPS: 70.0000 mg | ORAL_CAPSULE | Freq: Every day | ORAL | 0 refills | Status: DC

## 2021-04-01 ENCOUNTER — Ambulatory Visit: Payer: Managed Care, Other (non HMO) | Admitting: Physician Assistant

## 2021-04-02 ENCOUNTER — Ambulatory Visit: Payer: Managed Care, Other (non HMO) | Admitting: Physician Assistant

## 2021-04-06 ENCOUNTER — Ambulatory Visit: Payer: Managed Care, Other (non HMO) | Admitting: Physician Assistant
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
## 2021-04-08 ENCOUNTER — Other Ambulatory Visit: Payer: Self-pay

## 2021-04-08 ENCOUNTER — Encounter: Payer: Self-pay | Admitting: Physician Assistant

## 2021-04-08 ENCOUNTER — Ambulatory Visit (INDEPENDENT_AMBULATORY_CARE_PROVIDER_SITE_OTHER): Payer: Managed Care, Other (non HMO) | Admitting: Physician Assistant

## 2021-04-08 VITALS — BP 125/84 | HR 80 | Temp 97.9°F | Ht 65.0 in | Wt 193.4 lb

## 2021-04-08 DIAGNOSIS — F909 Attention-deficit hyperactivity disorder, unspecified type: Secondary | ICD-10-CM

## 2021-04-08 DIAGNOSIS — F339 Major depressive disorder, recurrent, unspecified: Secondary | ICD-10-CM

## 2021-04-08 DIAGNOSIS — G629 Polyneuropathy, unspecified: Secondary | ICD-10-CM | POA: Diagnosis not present

## 2021-04-08 MED ORDER — LISDEXAMFETAMINE DIMESYLATE 70 MG PO CAPS: 70.0000 mg | ORAL_CAPSULE | Freq: Every day | ORAL | 0 refills | Status: DC

## 2021-04-08 MED ORDER — FLUOXETINE HCL 20 MG PO CAPS: 20.0000 mg | ORAL_CAPSULE | Freq: Every day | ORAL | 3 refills | Status: DC

## 2021-04-08 MED ORDER — GABAPENTIN 300 MG PO CAPS: 300.0000 mg | ORAL_CAPSULE | Freq: Every day | ORAL | 1 refills | Status: DC

## 2021-04-08 NOTE — Patient Instructions (Signed)
Major Depressive Disorder, Adult °Major depressive disorder is a mental health condition. This disorder affects feelings. It can also affect the body. Symptoms of this condition last most of the day, almost every day, for 2 weeks. This disorder can affect: °Relationships. °Daily activities, such as work and school. °Activities that you normally like to do. °What are the causes? °The cause of this condition is not known. The disorder is likely caused by a mix of things, including: °Your personality, such as being a shy person. °Your behavior, or how you act toward others. °Your thoughts and feelings. °Too much alcohol or drugs. °How you react to stress. °Health and mental problems that you have had for a long time. °Things that hurt you in the past (trauma). °Big changes in your life, such as divorce. °What increases the risk? °The following factors may make you more likely to develop this condition: °Having family members with depression. °Being a woman. °Problems in the family. °Low levels of some brain chemicals. °Things that caused you pain as a child, especially if you lost a parent or were abused. °A lot of stress in your life, such as from: °Living without basic needs of life, such as food and shelter. °Being treated poorly because of race, sex, or religion (discrimination). °Health and mental problems that you have had for a long time. °What are the signs or symptoms? °The main symptoms of this condition are: °Being sad all the time. °Being grouchy all the time. °Loss of interest in things and activities. °Other symptoms include: °Sleeping too much or too little. °Eating too much or too little. °Gaining or losing weight, without knowing why. °Feeling tired or having low energy. °Being restless and weak. °Feeling hopeless, worthless, or guilty. °Trouble thinking clearly or making decisions. °Thoughts of hurting yourself or others, or thoughts of ending your life. °Spending a lot of time alone. °Inability to  complete common tasks of daily life. °If you have very bad MDD, you may: °Believe things that are not true. °Hear, see, taste, or feel things that are not there. °Have mild depression that lasts for at least 2 years. °Feel very sad and hopeless. °Have trouble speaking or moving. °How is this treated? °This condition may be treated with: °Talk therapy. This teaches you to know bad thoughts, feelings, and actions and how to change them. °This can also help you to communicate with others. °This can be done with members of your family. °Medicines. These can be used to treat worry (anxiety), depression, or low levels of chemicals in the brain. °Lifestyle changes. You may need to: °Limit alcohol use. °Limit drug use. °Get regular exercise. °Get plenty of sleep. °Make healthy eating choices. °Spend more time outdoors. °Brain stimulation. This treatment excites the brain. This is done when symptoms are very bad or have not gotten better with other treatments. °Follow these instructions at home: °Activity °Get regular exercise as told. °Spend time outdoors as told. °Make time to do the things you enjoy. °Find ways to deal with stress. Try to: °Meditate. °Do deep breathing. °Spend time in nature. °Keep a journal. °Return to your normal activities as told by your doctor. Ask your doctor what activities are safe for you. °Alcohol and drug use °If you drink alcohol: °Limit how much you use to: °0-1 drink a day for women. °0-2 drinks a day for men. °Be aware of how much alcohol is in your drink. In the [place] one drink equals one 12 oz bottle of beer (355 mL),   one 5 oz glass of wine (148 mL), or one 1½ oz glass of hard liquor (44 mL). °Talk to your doctor about: °Alcohol use. Alcohol can affect some medicines. °Any drug use. °General instructions ° °Take over-the-counter and prescription medicines and herbal preparations only as told by your doctor. °Eat a healthy diet. °Get a lot of sleep. °Think about joining a support group.  Your doctor may be able to suggest one. °Keep all follow-up visits as told by your doctor. This is important. °Where to find more information: °National Alliance on Mental Illness: www.nami.org °[place] National Institute of Mental Health: www.nimh.nih.gov °American Psychiatric Association: www.psychiatry.org/patients-families/ °Contact a doctor if: °Your symptoms get worse. °You get new symptoms. °Get help right away if: °You hurt yourself. °You have serious thoughts about hurting yourself or others. °You see, hear, taste, smell, or feel things that are not there. °If you ever feel like you may hurt yourself or others, or have thoughts about taking your own life, get help right away. Go to your nearest emergency department or: °Call your local emergency services (911 in the [place] °Call a suicide crisis helpline, such as the National Suicide Prevention Lifeline at 1-800-273-8255 or 988 in the [place] This is open 24 hours a day in the [place] °Text the Crisis Text Line at 741741 (in the [place] °Summary °Major depressive disorder is a mental health condition. This disorder affects feelings. Symptoms of this condition last most of the day, almost every day, for 2 weeks. °The symptoms of this disorder can cause problems with relationships and with daily activities. °There are treatments and support for people who get this disorder. You may need more than one type of treatment. °Get help right away if you have serious thoughts about hurting yourself or others. °This information is not intended to replace advice given to you by your health care provider. Make sure you discuss any questions you have with your health care provider. °Document Revised: 11/26/2020 Document Reviewed: 04/14/2019 °Elsevier Patient Education © 2022 [employer] ° °
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
## 2021-06-08 ENCOUNTER — Ambulatory Visit: Payer: Managed Care, Other (non HMO) | Admitting: Physician Assistant

## 2021-06-18 ENCOUNTER — Other Ambulatory Visit: Payer: Self-pay | Admitting: Physician Assistant

## 2021-06-18 ENCOUNTER — Other Ambulatory Visit: Payer: Self-pay

## 2021-06-18 ENCOUNTER — Encounter: Payer: Self-pay | Admitting: Physician Assistant

## 2021-06-18 ENCOUNTER — Ambulatory Visit (INDEPENDENT_AMBULATORY_CARE_PROVIDER_SITE_OTHER): Payer: Managed Care, Other (non HMO) | Admitting: Physician Assistant

## 2021-06-18 VITALS — BP 121/83 | HR 74 | Temp 98.0°F | Ht 64.0 in | Wt 194.0 lb

## 2021-06-18 DIAGNOSIS — F339 Major depressive disorder, recurrent, unspecified: Secondary | ICD-10-CM

## 2021-06-18 DIAGNOSIS — F32A Depression, unspecified: Secondary | ICD-10-CM

## 2021-06-18 DIAGNOSIS — G629 Polyneuropathy, unspecified: Secondary | ICD-10-CM | POA: Diagnosis not present

## 2021-06-18 DIAGNOSIS — F909 Attention-deficit hyperactivity disorder, unspecified type: Secondary | ICD-10-CM | POA: Diagnosis not present

## 2021-06-18 MED ORDER — FLUOXETINE HCL 40 MG PO CAPS: 40.0000 mg | ORAL_CAPSULE | Freq: Every day | ORAL | 1 refills | Status: DC

## 2021-06-18 MED ORDER — GABAPENTIN 600 MG PO TABS: 600.0000 mg | ORAL_TABLET | Freq: Every day | ORAL | 1 refills | Status: DC

## 2021-06-18 NOTE — Assessment & Plan Note (Signed)
-  Recommend to continue Gabapentin 600 mg if effective, will provide updated rx. -Will continue to monitor.

## 2021-06-18 NOTE — Assessment & Plan Note (Signed)
-  Discussed with increase Prozac to 40 mg to continue to improve mood. Continue Wellbutrin XL 300 mg and Buspar 10 mg TID (as needed). Patient verbalized understanding. -Will continue to monitor and reassess medication therapy at follow up visit.

## 2021-06-18 NOTE — Progress Notes (Signed)
Established patient visit   Patient: Laurie Arnold   DOB: [birth year]   56 y.o. Female  MRN: [id number] Visit Date: 06/18/2021  Chief Complaint  Patient presents with   Follow-up    Mood, ADHD   Subjective    HPI HPI     Follow-up    Additional comments: Mood, ADHD      Last edited by Lupita Leash, CMA on 06/18/2021  9:03 AM.      Patient presents for follow up on ADHD and mood.   ADHD: Patient has been on Vyvanse for a long period of time. Tolerating without issues. 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 Will be starting with [employer] in [place] [employer]. No chest pain, shortness of breath, sleep disturbance or mood changes.  Mood: Patient reports the addition of Prozac has helped with managing her home stress better and feels more motivated to do things when she is off. Inquiring about increasing dose of Prozac. Takes Buspar once daily and when needed more than once a day. Taking Wellbutrin as directed. No SI/HI.  Neuropathy: Patient reports has been taking Gabapentin 600 mg at bedtime which has been more effective with controlling her neuropathy and also with elbow pain. States had episode of tennis elbow last year and received a cortisone injection which helped but lately her elbow has been hurting again.  Depression screen [facility] 2/9 06/18/2021 04/08/2021 01/01/2021 10/01/2020 06/16/2020  Decreased Interest 0 0 0 0 0  Down, Depressed, Hopeless 0 0 0 0 0  PHQ - 2 Score 0 0 0 0 0  Altered sleeping 1 0 0 0 0  Tired, decreased energy 0 1 0 0 0  Change in appetite 0 0 0 0 0  Feeling bad or failure about yourself  0 0 0 0 0  Trouble concentrating 0 0 0 0 0  Moving slowly or fidgety/restless 0 0 0 0 0  Suicidal thoughts 0 0 0 0 0  PHQ-9 Score 1 1 0 0 0  Difficult doing work/chores Not difficult at all Not difficult at all - Not difficult at all -  Some recent data might be hidden   GAD 7 : Generalized  Anxiety Score 06/18/2021 04/08/2021 01/01/2021 11/28/2018  Nervous, Anxious, on Edge 0 0 0 0  Control/stop worrying 0 0 0 0  Worry too much - different things 0 0 0 0  Trouble relaxing 0 0 0 0  Restless 0 0 0 0  Easily annoyed or irritable 0 0 0 1  Afraid - awful might happen 0 0 0 0  Total GAD 7 Score 0 0 0 1  Anxiety Difficulty Not difficult at all Not difficult at all - Not difficult at all        Medications: Outpatient Medications Prior to Visit  Medication Sig   Acetaminophen (TYLENOL ARTHRITIS PAIN PO) Take by mouth as needed.   busPIRone (BUSPAR) 10 MG tablet Take 1 tablet (10 mg total) by mouth 3 (three) times daily.   cetirizine (ZYRTEC) 10 MG tablet Take 1 tablet by mouth daily.   famotidine (PEPCID) 20 MG tablet Take 20 mg by mouth daily.    hydrocortisone (ANUSOL-HC) 2.5 % rectal cream Place 1 application rectally 2 (two) times daily.   hydrocortisone (ANUSOL-HC) 25 MG suppository Place 1 suppository (25 mg total) rectally 2 (two) times daily.   levothyroxine (SYNTHROID) 88 MCG tablet Take 1 tablet (88 mcg total)  by mouth daily.   lisdexamfetamine (VYVANSE) 70 MG capsule Take 1 capsule (70 mg total) by mouth daily.   lisdexamfetamine (VYVANSE) 70 MG capsule Take 1 capsule (70 mg total) by mouth daily.   ondansetron (ZOFRAN) 4 MG tablet Take 1 tablet (4 mg total) by mouth every 8 (eight) hours as needed for nausea or vomiting.   [DISCONTINUED] buPROPion (WELLBUTRIN XL) 300 MG 24 hr tablet Take 1 tablet (300 mg total) by mouth daily.   [DISCONTINUED] FLUoxetine (PROZAC) 20 MG capsule Take 1 capsule (20 mg total) by mouth daily.   [DISCONTINUED] gabapentin (NEURONTIN) 300 MG capsule Take 1 capsule (300 mg total) by mouth daily.   VYVANSE 70 MG capsule Take 1 capsule (70 mg total) by mouth daily.   No facility-administered medications prior to visit.    Review of Systems Review of Systems:  A fourteen system review of systems was performed and found to be positive as per  HPI.     Objective    BP 121/83    Pulse 74    Temp 98 F (36.7 C)    Ht 5\' 4"  (1.626 m)    Wt 194 lb (88 kg)    SpO2 98%    BMI 33.30 kg/m  BP Readings from Last 3 Encounters:  06/18/21 121/83  04/08/21 125/84  01/01/21 139/84   Wt Readings from Last 3 Encounters:  06/18/21 194 lb (88 kg)  04/08/21 193 lb 6.4 oz (87.7 kg)  01/01/21 193 lb 14.4 oz (88 kg)    Physical Exam  General:  Well Developed, well nourished, appropriate for stated age.  Neuro:  Alert and oriented,  extra-ocular muscles intact  HEENT:  Normocephalic, atraumatic, neck supple  Skin:  no gross rash, warm, pink. Cardiac:  RRR, S1 S2 Respiratory: CTA B/L  Vascular:  Ext warm, no cyanosis apprec.; cap RF less 2 sec. Psych:  No HI/SI, judgement and insight good, Euthymic mood. Full Affect.   No results found for any visits on 06/18/21.  Assessment & Plan      Problem List Items Addressed This Visit       Nervous and Auditory   Neuropathy    -Recommend to continue Gabapentin 600 mg if effective, will provide updated rx. -Will continue to monitor.        Other   Depression (Chronic)    -Discussed with increase Prozac to 40 mg to continue to improve mood. Continue Wellbutrin XL 300 mg and Buspar 10 mg TID (as needed). Patient verbalized understanding. -Will continue to monitor and reassess medication therapy at follow up visit.       Relevant Medications   FLUoxetine (PROZAC) 40 MG capsule   Attention deficit hyperactivity disorder (ADHD) - Primary    -Stable. -Will continue current medication regimen. Patient not due for refill yet. -Will update controlled substance contract at follow up visit.   -Follow up in 3 months for medication management.        Return in about 3 months (around 09/15/2021) for Mood, ADHD, thyroid .        11/15/2021, PA-C  [facility] at [facility] [phone] (phone) [phone] (fax)  [facility]

## 2021-06-18 NOTE — Assessment & Plan Note (Signed)
-  Stable. -Will continue current medication regimen. Patient not due for refill yet. -Will update controlled substance contract at follow up visit.   -Follow up in 3 months for medication management.

## 2021-06-19 ENCOUNTER — Other Ambulatory Visit: Payer: Self-pay | Admitting: Physician Assistant

## 2021-06-19 DIAGNOSIS — F909 Attention-deficit hyperactivity disorder, unspecified type: Secondary | ICD-10-CM

## 2021-06-22 ENCOUNTER — Other Ambulatory Visit: Payer: Self-pay | Admitting: Physician Assistant

## 2021-06-22 ENCOUNTER — Encounter: Payer: Self-pay | Admitting: Physician Assistant

## 2021-06-22 DIAGNOSIS — F909 Attention-deficit hyperactivity disorder, unspecified type: Secondary | ICD-10-CM

## 2021-06-22 MED ORDER — LISDEXAMFETAMINE DIMESYLATE 70 MG PO CAPS: 70.0000 mg | ORAL_CAPSULE | Freq: Every day | ORAL | 0 refills | Status: DC

## 2021-06-22 MED ORDER — VYVANSE 70 MG PO CAPS: 70.0000 mg | ORAL_CAPSULE | Freq: Every day | ORAL | 0 refills | Status: DC

## 2021-06-23 ENCOUNTER — Ambulatory Visit: Payer: Managed Care, Other (non HMO) | Admitting: Physician Assistant

## 2021-08-27 ENCOUNTER — Other Ambulatory Visit: Payer: Self-pay | Admitting: Physician Assistant

## 2021-08-27 DIAGNOSIS — F909 Attention-deficit hyperactivity disorder, unspecified type: Secondary | ICD-10-CM

## 2021-08-31 ENCOUNTER — Encounter: Payer: Self-pay | Admitting: Physician Assistant

## 2021-09-03 ENCOUNTER — Telehealth: Payer: Self-pay | Admitting: Physician Assistant

## 2021-09-03 NOTE — Telephone Encounter (Signed)
Spoke with the patient and I completed the PA for her vyvanse. Awating PA approval or denial. Made patient aware she would here back from me as soon as I get any update.  ?

## 2021-09-03 NOTE — Telephone Encounter (Signed)
Left msg for patient to call back. AS, CMA 

## 2021-09-03 NOTE — Telephone Encounter (Signed)
Patient switched insurances and I have put in the correct one for her however she paid out of pocked for the Vyvanse but needs [place] to send it to pharmacy to get a PA started so she won't have to pay the whole price. Please advise.  ?

## 2021-09-03 NOTE — Telephone Encounter (Signed)
Patient is not due for a refill until 09/23/21. We are unable to send another RX until patient is due. AS, CMA ?

## 2021-09-05 ENCOUNTER — Other Ambulatory Visit: Payer: Self-pay | Admitting: Physician Assistant

## 2021-09-05 DIAGNOSIS — F32A Depression, unspecified: Secondary | ICD-10-CM

## 2021-09-05 DIAGNOSIS — E039 Hypothyroidism, unspecified: Secondary | ICD-10-CM

## 2021-09-10 NOTE — Patient Instructions (Addendum)
Binge-Eating Disorder Binge-eating disorder is a condition that involves repeated episodes of binge-eating. Binge-eating refers to eating a larger-than-normal amount of food in a short period of time, usually within 2 hours. People with this condition may eat even when they are not hungry, and they do not stop eating even when they feel full. People with binge-eating disorder feel unable to control their eating. Although they feel bad about overeating, they usually do not try to undo the bingeing by using laxatives or making themselves vomit. They do not starve themselves or exercise too much. Binge-eating disorder usually starts in the teenage years or early 20s. It often gets worse with stress. What are the causes? The cause of this condition is not known. However, it may be influenced by: Having a family history of eating disorders. Factors that are inherited from family (genetics). Experiencing trauma. Having low self-esteem or poor body image. Having other mental health issues. What increases the risk? The following factors may make you more likely to develop this condition: Being a teenager or in your early 20s. Being female. Binge-eating disorder can affect males, but it is more common in females. Being overweight or obese. Having a mental health disorder, such as depression or anxiety. Having a substance use disorder, such as alcohol use disorder. Having a history of unhealthy dieting, such as meal skipping, yo-yo dieting, food restricting, or avoiding certain kinds of foods. What are the signs or symptoms? Symptoms of this condition include: Eating much more quickly than normal. Eating to the point of feeling physically uncomfortable. Eating large amounts of food when you are not hungry. Eating alone because you are embarrassed about how much you are eating. Feeling disgusted, depressed, or guilty after overeating. How is this diagnosed? This condition is diagnosed through an  assessment by your health care provider. You may be diagnosed with the disorder if you: Binge-eat an average of one or more times a week for three months or longer. Have three or more of the symptoms of the disorder. Once you have been diagnosed, your level of binge-eating disorder will be rated from mild to severe. The rating is based on how often you binge-eat. How is this treated? This condition may be treated with: Cognitive behavioral therapy (CBT). This is a form of talk therapy that helps you recognize the thoughts, beliefs, and emotions that contribute to overeating. It also helps you change them. Interpersonal psychotherapy. This is a form of talk therapy that focuses on fixing relationship problems that trigger binge-eating episodes. Dialectical behavioral therapy (DBT). This is a form of talk therapy that helps you learn skills to control your emotions and tolerate distress without binge-eating. Medicine. Treatment is usually provided by mental health professionals, such as psychologists, psychiatrists, licensed professional counselors, and clinical social workers. Follow these instructions at home: Lifestyle     Work to develop a healthy relationship with food. Talk with your health care provider or a nutrition specialist (dietitian). He or she can provide guidance about healthy eating and healthy lifestyle choices. Eat a healthy diet that consists of lean meats and low-fat dairy products, as well as foods that are high in fiber, such as fresh fruits and vegetables, whole grains, and beans. Start an exercise routine and stay active. Aim for 30 minutes of exercise a day on 5 or more days a week to keep your body strong and healthy. You may need to exercise more if you want to lose weight. Talk with your health care provider about how much   and what type of exercise you can do. Some ways to be active include: Playing sports. Biking. Skating or skateboarding. Dancing. Running, walking,  jogging, or hiking. Doing yard work. General instructions Take over-the-counter and prescription medicines only as told by your health care provider. Drink enough fluid to keep your urine pale yellow. Keep all follow-up visits. This is important. Where to find more information National Eating Disorders Association (NEDA): www.nationaleatingdisorders.org NEDA Helpline: 1-800-931-2237 Contact a health care provider if: Your symptoms get worse. You start having new symptoms. You start compensating for eating binges with harmful behaviors, such as: Making yourself vomit. Exercising too much. Using laxatives. Get help right away if: You have serious thoughts about hurting yourself or someone else. If you ever feel like you may hurt yourself or others, or have thoughts about taking your own life, get help right away. You can go to your nearest emergency department or: Call your local emergency services (911 in the [place] Call a suicide crisis helpline, such as the National Suicide Prevention Lifeline at 1-800-273-8255 or 988 in the [place] This is open 24 hours a day in the [place] Text the Crisis Text Line at 741741 (in the [place] Summary You may have binge-eating disorder if you have feelings of guilt from overeating, eat to the point of feeling uncomfortable, eat a large amount of food in a short time, or find yourself eating when you are not hungry. Seek help from your health care provider. The exact cause of a binge-eating disorder is not known. There are some risk factors for this disease, such as having a mental health disorder and having a history of unhealthy dieting. There are a variety of treatment options such as counseling therapy and medicines. These can help you overcome your binge-eating disorder. This information is not intended to replace advice given to you by your health care provider. Make sure you discuss any questions you have with your health care provider. Document Revised:  11/26/2020 Document Reviewed: 10/01/2020 Elsevier Patient Education  2023 [employer].  

## 2021-09-15 ENCOUNTER — Encounter: Payer: Self-pay | Admitting: Physician Assistant

## 2021-09-15 ENCOUNTER — Ambulatory Visit (INDEPENDENT_AMBULATORY_CARE_PROVIDER_SITE_OTHER): Payer: BC Managed Care – PPO | Admitting: Physician Assistant

## 2021-09-15 VITALS — BP 138/84 | HR 77 | Temp 97.7°F | Ht 65.0 in | Wt 203.0 lb

## 2021-09-15 DIAGNOSIS — E6609 Other obesity due to excess calories: Secondary | ICD-10-CM

## 2021-09-15 DIAGNOSIS — F339 Major depressive disorder, recurrent, unspecified: Secondary | ICD-10-CM | POA: Diagnosis not present

## 2021-09-15 DIAGNOSIS — Z6833 Body mass index (BMI) 33.0-33.9, adult: Secondary | ICD-10-CM

## 2021-09-15 DIAGNOSIS — F909 Attention-deficit hyperactivity disorder, unspecified type: Secondary | ICD-10-CM

## 2021-09-15 MED ORDER — NALTREXONE HCL 50 MG PO TABS: ORAL_TABLET | ORAL | 2 refills | Status: DC

## 2021-09-15 MED ORDER — METHYLPHENIDATE HCL ER (OSM) 45 MG PO TBCR: 1.0000 | EXTENDED_RELEASE_TABLET | Freq: Every day | ORAL | 0 refills | Status: DC

## 2021-09-15 NOTE — Assessment & Plan Note (Signed)
-  PHQ-9 score of 0. Will continue Prozac 40 mg, Bupropion XL 300 mg and Buspar 10 mg (pt taking Buspar once daily). Will continue to monitor. ?

## 2021-09-15 NOTE — Progress Notes (Signed)
?Established patient visit ? ? ?Patient: Laurie Arnold   DOB: [birth year]   56 y.o. Female  MRN: [id number] ?Visit Date: 09/15/2021 ? ?Chief Complaint  ?Patient presents with  ? ADHD  ? Weight Check  ? ?Subjective  ?  ?HPI  ?Patient presents for follow-up on ADHD. Patient has been on Vyvanse 70 mg for several years which has worked well but with her new insurance it is not covered unless she tries three generic formulary alternatives. States wants to try generic Concerta. In the past tried generic Adderall which she did not tolerate due to side effects. Also reports has been binge eating especially at night and wants to try something that will help with her symptoms. Currently on Wellbutrin XL 300 mg for mood. States generic medications of Contrave are covered individually.  ? ? ? ?  09/15/2021  ?  3:59 PM 06/18/2021  ?  9:05 AM 04/08/2021  ?  4:16 PM 01/01/2021  ?  3:53 PM 10/01/2020  ?  3:18 PM  ?Depression screen PHQ 2/9  ?Decreased Interest 0 0 0 0 0  ?Down, Depressed, Hopeless 0 0 0 0 0  ?PHQ - 2 Score 0 0 0 0 0  ?Altered sleeping 0 1 0 0 0  ?Tired, decreased energy 0 0 1 0 0  ?Change in appetite 0 0 0 0 0  ?Feeling bad or failure about yourself  0 0 0 0 0  ?Trouble concentrating 0 0 0 0 0  ?Moving slowly or fidgety/restless 0 0 0 0 0  ?Suicidal thoughts 0 0 0 0 0  ?PHQ-9 Score 0 1 1 0 0  ?Difficult doing work/chores Not difficult at all Not difficult at all Not difficult at all  Not difficult at all  ? ? ?  09/15/2021  ?  4:00 PM 06/18/2021  ?  9:05 AM 04/08/2021  ?  4:16 PM 01/01/2021  ?  3:53 PM  ?GAD 7 : Generalized Anxiety Score  ?Nervous, Anxious, on Edge 0 0 0 0  ?Control/stop worrying 0 0 0 0  ?Worry too much - different things 0 0 0 0  ?Trouble relaxing 0 0 0 0  ?Restless 0 0 0 0  ?Easily annoyed or irritable 0 0 0 0  ?Afraid - awful might happen 0 0 0 0  ?Total GAD 7 Score 0 0 0 0  ?Anxiety Difficulty Not difficult at all Not difficult at all Not difficult at all   ? ? ? ? ?Medications: ?Outpatient Medications  Prior to Visit  ?Medication Sig Note  ? Acetaminophen (TYLENOL ARTHRITIS PAIN PO) Take by mouth as needed.   ? buPROPion (WELLBUTRIN XL) 300 MG 24 hr tablet TAKE 1 TABLET BY MOUTH EVERY DAY   ? busPIRone (BUSPAR) 10 MG tablet Take 1 tablet (10 mg total) by mouth 3 (three) times daily.   ? cetirizine (ZYRTEC) 10 MG tablet Take 1 tablet by mouth daily.   ? famotidine (PEPCID) 20 MG tablet Take 20 mg by mouth daily.    ? FLUoxetine (PROZAC) 40 MG capsule Take 1 capsule (40 mg total) by mouth daily.   ? gabapentin (NEURONTIN) 600 MG tablet Take 1 tablet (600 mg total) by mouth at bedtime.   ? hydrocortisone (ANUSOL-HC) 2.5 % rectal cream Place 1 application rectally 2 (two) times daily.   ? hydrocortisone (ANUSOL-HC) 25 MG suppository Place 1 suppository (25 mg total) rectally 2 (two) times daily.   ? levothyroxine (SYNTHROID) 88 MCG tablet TAKE 1 TABLET BY MOUTH EVERY  DAY   ? lisdexamfetamine (VYVANSE) 70 MG capsule Take 1 capsule (70 mg total) by mouth daily.   ? ondansetron (ZOFRAN) 4 MG tablet Take 1 tablet (4 mg total) by mouth every 8 (eight) hours as needed for nausea or vomiting.   ? VYVANSE 70 MG capsule Take 1 capsule (70 mg total) by mouth daily.   ? [DISCONTINUED] lisdexamfetamine (VYVANSE) 70 MG capsule Take 1 capsule (70 mg total) by mouth daily. 09/15/2021: Vyvanse not covered by insurance  ? ?No facility-administered medications prior to visit.  ? ? ?Review of Systems ?Review of Systems:  ?A fourteen system review of systems was performed and found to be positive as per HPI. ? ? ?Last CBC ?Lab Results  ?Component Value Date  ? WBC 6.0 09/12/2020  ? HGB 15.2 09/12/2020  ? HCT 45.1 09/12/2020  ? MCV 92 09/12/2020  ? MCH 30.8 09/12/2020  ? RDW 13.7 09/12/2020  ? PLT 327 09/12/2020  ? ?Last metabolic panel ?Lab Results  ?Component Value Date  ? GLUCOSE 103 (H) 09/12/2020  ? NA 146 (H) 09/12/2020  ? K 4.7 09/12/2020  ? CL 106 09/12/2020  ? CO2 21 09/12/2020  ? BUN 15 09/12/2020  ? CREATININE 0.80 09/12/2020  ?  EGFR 89 09/12/2020  ? CALCIUM 8.9 09/12/2020  ? PROT 7.1 09/12/2020  ? ALBUMIN 4.4 09/12/2020  ? LABGLOB 2.7 09/12/2020  ? AGRATIO 1.6 09/12/2020  ? BILITOT <0.2 09/12/2020  ? ALKPHOS 96 09/12/2020  ? AST 25 09/12/2020  ? ALT 23 09/12/2020  ? ANIONGAP 12 02/27/2018  ? ?Last lipids ?Lab Results  ?Component Value Date  ? CHOL 293 (H) 09/12/2020  ? HDL 64 09/12/2020  ? [place] 198 (H) 09/12/2020  ? TRIG 165 (H) 09/12/2020  ? CHOLHDL 4.4 02/28/2019  ? ?Last hemoglobin A1c ?Lab Results  ?Component Value Date  ? HGBA1C 5.6 02/28/2019  ? ?Last thyroid functions ?Lab Results  ?Component Value Date  ? TSH 1.690 09/12/2020  ? T3TOTAL 95 12/09/2017  ? ?  Objective  ?  ?BP 138/84   Pulse 77   Temp 97.7 ?F (36.5 ?C)   Ht _0  (1.651 m)   Wt 203 lb (92.1 kg)   SpO2 98%   BMI 33.78 kg/m?  ?BP Readings from Last 3 Encounters:  ?09/15/21 138/84  ?06/18/21 121/83  ?04/08/21 125/84  ? ?Wt Readings from Last 3 Encounters:  ?09/15/21 203 lb (92.1 kg)  ?06/18/21 194 lb (88 kg)  ?04/08/21 193 lb 6.4 oz (87.7 kg)  ? ? ?Physical Exam  ?General:  Pleasant and cooperative, appropriate for stated age.  ?Neuro:  Alert and oriented,  extra-ocular muscles intact  ?HEENT:  Normocephalic, atraumatic, neck supple  ?Skin:  no gross rash, warm, pink. ?Cardiac:  RRR, S1 S2 ?Respiratory: CTA B/L  ?Vascular:  Ext warm, no cyanosis apprec.; cap RF less 2 sec. ?Psych:  No HI/SI, judgement and insight good, Euthymic mood. Full Affect. ? ? ?No results found for any visits on 09/15/21. ? Assessment & Plan  ?  ? ? ?Problem List Items Addressed This Visit   ? ?  ? Other  ? Depression (Chronic)  ?  -PHQ-9 score of 0. Will continue Prozac 40 mg, Bupropion XL 300 mg and Buspar 10 mg (pt taking Buspar once daily). Will continue to monitor. ? ?  ?  ? Attention deficit hyperactivity disorder (ADHD) - Primary  ?  -Will change Vyvanse 70 mg to Methylphenidate HCL ER OS 45 mg which patient verified is  covered by her insurance through her app. Advised to let me  know if has any issues with medication. Will continue to monitor every 3 months. ? ?  ?  ? Relevant Medications  ? Methylphenidate HCl ER, OSM, 45 MG TBCR  ? Class 1 obesity in adult  ?  -Patient has gained 9 pounds since last visit. Patient has cardiovascular risk factors and would benefit from weight loss to reduce risk. Recommend to continue Bupropion XL 300 mg and will start Naltrexone 25 mg once daily x 1 week and then increase to twice daily. Will reassess weight and medication therapy at f/up visit. ? ?  ?  ? Relevant Medications  ? Methylphenidate HCl ER, OSM, 45 MG TBCR  ? naltrexone (DEPADE) 50 MG tablet  ? ? ?Return in about 3 months (around 12/16/2021) for ADHD, Mood, Wt.  ?   ? ? ? ?Lorrene Reid, PA-C  ?[facility] ?[phone] (phone) ?[phone] (fax) ? ?[facility] ?

## 2021-09-15 NOTE — Assessment & Plan Note (Addendum)
>>  ASSESSMENT AND PLAN FOR OVERWEIGHT WITH BODY MASS INDEX (BMI) OF 26 TO 26.9 IN ADULT WRITTEN ON 09/15/2021  4:37 PM BY Leonid Manus, PA-C  -Will change Vyvanse 70 mg to Methylphenidate HCL ER OS 45 mg which patient verified is covered by her insurance through her app. Advised to let me know if has any issues with medication. Will continue to monitor every 3 months.  >>ASSESSMENT AND PLAN FOR CLASS 1 OBESITY IN ADULT WRITTEN ON 09/15/2021  4:41 PM BY Breionna Punt, PA-C  -Patient has gained 9 pounds since last visit. Patient has cardiovascular risk factors and would benefit from weight loss to reduce risk. Recommend to continue Bupropion XL 300 mg and will start Naltrexone 25 mg once daily x 1 week and then increase to twice daily. Will reassess weight and medication therapy at f/up visit.

## 2021-09-15 NOTE — Assessment & Plan Note (Signed)
-  Patient has gained 9 pounds since last visit. Patient has cardiovascular risk factors and would benefit from weight loss to reduce risk. Recommend to continue Bupropion XL 300 mg and will start Naltrexone 25 mg once daily x 1 week and then increase to twice daily. Will reassess weight and medication therapy at f/up visit. ?

## 2021-09-21 ENCOUNTER — Other Ambulatory Visit: Payer: Self-pay | Admitting: Physician Assistant

## 2021-09-21 DIAGNOSIS — F909 Attention-deficit hyperactivity disorder, unspecified type: Secondary | ICD-10-CM

## 2021-09-21 MED ORDER — METHYLPHENIDATE HCL ER 18 MG PO TB24: 18.0000 mg | ORAL_TABLET | Freq: Every day | ORAL | 0 refills | Status: DC

## 2021-10-02 ENCOUNTER — Telehealth: Payer: Self-pay | Admitting: Physician Assistant

## 2021-10-02 DIAGNOSIS — F909 Attention-deficit hyperactivity disorder, unspecified type: Secondary | ICD-10-CM

## 2021-10-02 MED ORDER — METHYLPHENIDATE HCL ER (OSM) 27 MG PO TBCR: 27.0000 mg | EXTENDED_RELEASE_TABLET | ORAL | 0 refills | Status: DC

## 2021-10-02 NOTE — Telephone Encounter (Signed)
The new medication she has started on is not helping at all. She states it's affecting her negatively and is asking for an increase in dosage. Please advise.

## 2021-10-02 NOTE — Telephone Encounter (Signed)
Patient states she isn't enough medication. She is having a hard time concentrating and messing up at work due to the lack of concentration. AS, CMA

## 2021-10-08 ENCOUNTER — Telehealth: Payer: Self-pay | Admitting: Physician Assistant

## 2021-10-08 ENCOUNTER — Other Ambulatory Visit: Payer: Self-pay | Admitting: Physician Assistant

## 2021-10-08 DIAGNOSIS — F909 Attention-deficit hyperactivity disorder, unspecified type: Secondary | ICD-10-CM

## 2021-10-08 MED ORDER — METHYLPHENIDATE HCL ER (OSM) 36 MG PO TBCR: 36.0000 mg | EXTENDED_RELEASE_TABLET | Freq: Every day | ORAL | 0 refills | Status: DC

## 2021-10-13 NOTE — Telephone Encounter (Signed)
Done. AS, CMA °

## 2021-11-08 ENCOUNTER — Other Ambulatory Visit: Payer: Self-pay | Admitting: Physician Assistant

## 2021-11-08 DIAGNOSIS — F909 Attention-deficit hyperactivity disorder, unspecified type: Secondary | ICD-10-CM

## 2021-11-09 ENCOUNTER — Telehealth: Payer: Self-pay | Admitting: Physician Assistant

## 2021-11-09 DIAGNOSIS — F909 Attention-deficit hyperactivity disorder, unspecified type: Secondary | ICD-10-CM

## 2021-11-10 MED ORDER — METHYLPHENIDATE HCL ER (OSM) 54 MG PO TBCR: 54.0000 mg | EXTENDED_RELEASE_TABLET | ORAL | 0 refills | Status: DC

## 2021-12-09 ENCOUNTER — Telehealth: Payer: Self-pay | Admitting: Physician Assistant

## 2021-12-09 ENCOUNTER — Other Ambulatory Visit: Payer: Self-pay

## 2021-12-09 DIAGNOSIS — F32A Depression, unspecified: Secondary | ICD-10-CM

## 2021-12-09 MED ORDER — GABAPENTIN 600 MG PO TABS: 600.0000 mg | ORAL_TABLET | Freq: Every day | ORAL | 1 refills | Status: DC

## 2021-12-09 MED ORDER — BUPROPION HCL ER (XL) 300 MG PO TB24: 300.0000 mg | ORAL_TABLET | Freq: Every day | ORAL | 1 refills | Status: DC

## 2021-12-09 NOTE — Telephone Encounter (Signed)
Patient has an upcoming appt with PCP on 12/15/21 and needs a refill on medication below.  gabapentin (NEURONTIN) 600 MG tablet   buPROPion (WELLBUTRIN XL) 300 MG 24 hr tablet   [id number] [place], [place] - [address] Phone:  [phone]  Fax:  [phone]

## 2021-12-09 NOTE — Telephone Encounter (Signed)
Rx's was sent to pharmacy 

## 2021-12-10 ENCOUNTER — Other Ambulatory Visit: Payer: Self-pay | Admitting: Physician Assistant

## 2021-12-10 DIAGNOSIS — F909 Attention-deficit hyperactivity disorder, unspecified type: Secondary | ICD-10-CM

## 2021-12-10 MED ORDER — METHYLPHENIDATE HCL ER (OSM) 54 MG PO TBCR: 54.0000 mg | EXTENDED_RELEASE_TABLET | ORAL | 0 refills | Status: DC

## 2021-12-10 NOTE — Telephone Encounter (Signed)
Patient has apt next Tuesday but will run out of Concerta on Friday. She is asking for a refill of that to [facility] to [facility] on [address].

## 2021-12-15 ENCOUNTER — Ambulatory Visit: Payer: BC Managed Care – PPO | Admitting: Physician Assistant

## 2021-12-16 ENCOUNTER — Other Ambulatory Visit: Payer: Self-pay | Admitting: Physician Assistant

## 2021-12-16 DIAGNOSIS — F339 Major depressive disorder, recurrent, unspecified: Secondary | ICD-10-CM

## 2021-12-22 ENCOUNTER — Encounter: Payer: Self-pay | Admitting: Physician Assistant

## 2021-12-22 ENCOUNTER — Ambulatory Visit (INDEPENDENT_AMBULATORY_CARE_PROVIDER_SITE_OTHER): Payer: BC Managed Care – PPO | Admitting: Physician Assistant

## 2021-12-22 VITALS — BP 126/74 | HR 75 | Temp 98.2°F | Resp 17 | Ht 65.0 in | Wt 199.2 lb

## 2021-12-22 DIAGNOSIS — Z1231 Encounter for screening mammogram for malignant neoplasm of breast: Secondary | ICD-10-CM | POA: Diagnosis not present

## 2021-12-22 DIAGNOSIS — F909 Attention-deficit hyperactivity disorder, unspecified type: Secondary | ICD-10-CM | POA: Diagnosis not present

## 2021-12-22 DIAGNOSIS — R110 Nausea: Secondary | ICD-10-CM | POA: Diagnosis not present

## 2021-12-22 DIAGNOSIS — K219 Gastro-esophageal reflux disease without esophagitis: Secondary | ICD-10-CM

## 2021-12-22 DIAGNOSIS — E669 Obesity, unspecified: Secondary | ICD-10-CM

## 2021-12-22 DIAGNOSIS — F339 Major depressive disorder, recurrent, unspecified: Secondary | ICD-10-CM

## 2021-12-22 DIAGNOSIS — Z6833 Body mass index (BMI) 33.0-33.9, adult: Secondary | ICD-10-CM

## 2021-12-22 MED ORDER — PANTOPRAZOLE SODIUM 20 MG PO TBEC: 20.0000 mg | DELAYED_RELEASE_TABLET | Freq: Every day | ORAL | 0 refills | Status: DC

## 2021-12-22 MED ORDER — ONDANSETRON HCL 4 MG PO TABS: 4.0000 mg | ORAL_TABLET | Freq: Three times a day (TID) | ORAL | 0 refills | Status: AC | PRN

## 2021-12-22 MED ORDER — METHYLPHENIDATE HCL ER (OSM) 18 MG PO TBCR: 18.0000 mg | EXTENDED_RELEASE_TABLET | Freq: Every day | ORAL | 0 refills | Status: DC

## 2021-12-22 NOTE — Assessment & Plan Note (Signed)
-  Stable. PHQ-9 score of 0. Continue current medication regimen, see med list. Will continue to monitor.

## 2021-12-22 NOTE — Progress Notes (Signed)
Established patient visit   Patient: Laurie Arnold   DOB: 10/24/1965   56 y.o. Female  MRN: [id number] Visit Date: 12/22/2021  Chief Complaint  Patient presents with   ADHD    Pt had been switched to a new brand and notes has been working upward for effectiveness, notes still not where she would like to be    Health Maintenance    Due for mammo   Weight Loss    Would like to revisit weight loss discussion   Subjective    HPI HPI     ADHD    Additional comments: Pt had been switched to a new brand and notes has been working upward for effectiveness, notes still not where she would like to be         Health Maintenance    Additional comments: Due for mammo        Weight Loss    Additional comments: Would like to revisit weight loss discussion      Last edited by Eldred Manges, CMA on 12/22/2021  4:05 PM.      Patient presents for follow-up on mood and medication management. Patient reports some improvement but still struggling with being able to concentrate and multi-task with methylphenidate 54 mg. Tolerating medication without issues. Patient reports has been struggling to lose weight. Naltrexone was ineffective. Has worked on diet changes, reduced soda, drinking more water and eating smaller portions. States would like to retry Ozempic. Patient also reports constantly clearing her throat and has a daily dry cough. Also reports wheezing in the mornings. Takes famotidine for her stomach not to get upset with her medications. States her mood has been stable, just feeling tired.      12/22/2021    4:04 PM 09/15/2021    3:59 PM 06/18/2021    9:05 AM 04/08/2021    4:16 PM 01/01/2021    3:53 PM  Depression screen PHQ 2/9  Decreased Interest 0 0 0 0 0  Down, Depressed, Hopeless 0 0 0 0 0  PHQ - 2 Score 0 0 0 0 0  Altered sleeping 0 0 1 0 0  Tired, decreased energy 0 0 0 1 0  Change in appetite 0 0 0 0 0  Feeling bad or failure about yourself  0 0 0 0 0  Trouble  concentrating 0 0 0 0 0  Moving slowly or fidgety/restless 0 0 0 0 0  Suicidal thoughts 0 0 0 0 0  PHQ-9 Score 0 0 1 1 0  Difficult doing work/chores  Not difficult at all Not difficult at all Not difficult at all       09/15/2021    4:00 PM 06/18/2021    9:05 AM 04/08/2021    4:16 PM 01/01/2021    3:53 PM  GAD 7 : Generalized Anxiety Score  Nervous, Anxious, on Edge 0 0 0 0  Control/stop worrying 0 0 0 0  Worry too much - different things 0 0 0 0  Trouble relaxing 0 0 0 0  Restless 0 0 0 0  Easily annoyed or irritable 0 0 0 0  Afraid - awful might happen 0 0 0 0  Total GAD 7 Score 0 0 0 0  Anxiety Difficulty Not difficult at all Not difficult at all Not difficult at all         Medications: Outpatient Medications Prior to Visit  Medication Sig   Acetaminophen (TYLENOL ARTHRITIS PAIN PO) Take by mouth as needed.  buPROPion (WELLBUTRIN XL) 300 MG 24 hr tablet Take 1 tablet (300 mg total) by mouth daily.   busPIRone (BUSPAR) 10 MG tablet Take 1 tablet (10 mg total) by mouth 3 (three) times daily. (Patient taking differently: Take 10 mg by mouth 3 (three) times daily. 1 time daily avg)   cetirizine (ZYRTEC) 10 MG tablet Take 1 tablet by mouth daily.   famotidine (PEPCID) 20 MG tablet Take 20 mg by mouth daily.    FLUoxetine (PROZAC) 40 MG capsule TAKE 1 CAPSULE (40 MG TOTAL) BY MOUTH DAILY.   gabapentin (NEURONTIN) 600 MG tablet Take 1 tablet (600 mg total) by mouth at bedtime.   hydrocortisone (ANUSOL-HC) 2.5 % rectal cream Place 1 application rectally 2 (two) times daily.   hydrocortisone (ANUSOL-HC) 25 MG suppository Place 1 suppository (25 mg total) rectally 2 (two) times daily.   levothyroxine (SYNTHROID) 88 MCG tablet TAKE 1 TABLET BY MOUTH EVERY DAY   methylphenidate (CONCERTA) 54 MG PO CR tablet Take 1 tablet (54 mg total) by mouth every morning.   [DISCONTINUED] naltrexone (DEPADE) 50 MG tablet Take 0.5 tablet by mouth daily x 1 week. Take 0.5 tablet by mouth twice daily.  (Patient not taking: Reported on 12/22/2021)   [DISCONTINUED] ondansetron (ZOFRAN) 4 MG tablet Take 1 tablet (4 mg total) by mouth every 8 (eight) hours as needed for nausea or vomiting. (Patient not taking: Reported on 12/22/2021)   No facility-administered medications prior to visit.    Review of Systems Review of Systems:  A fourteen system review of systems was performed and found to be positive as per HPI.  Last CBC Lab Results  Component Value Date   WBC 6.0 09/12/2020   HGB 15.2 09/12/2020   HCT 45.1 09/12/2020   MCV 92 09/12/2020   MCH 30.8 09/12/2020   RDW 13.7 09/12/2020   PLT 327 24/46/9507   Last metabolic panel Lab Results  Component Value Date   GLUCOSE 103 (H) 09/12/2020   NA 146 (H) 09/12/2020   K 4.7 09/12/2020   CL 106 09/12/2020   CO2 21 09/12/2020   BUN 15 09/12/2020   CREATININE 0.80 09/12/2020   EGFR 89 09/12/2020   CALCIUM 8.9 09/12/2020   PROT 7.1 09/12/2020   ALBUMIN 4.4 09/12/2020   LABGLOB 2.7 09/12/2020   AGRATIO 1.6 09/12/2020   BILITOT <0.2 09/12/2020   ALKPHOS 96 09/12/2020   AST 25 09/12/2020   ALT 23 09/12/2020   ANIONGAP 12 02/27/2018   Last lipids Lab Results  Component Value Date   CHOL 293 (H) 09/12/2020   HDL 64 09/12/2020   LDLCALC 198 (H) 09/12/2020   TRIG 165 (H) 09/12/2020   CHOLHDL 4.4 02/28/2019   Last hemoglobin A1c Lab Results  Component Value Date   HGBA1C 5.6 02/28/2019   Last thyroid functions Lab Results  Component Value Date   TSH 1.690 09/12/2020   T3TOTAL 95 12/09/2017   Last vitamin D Lab Results  Component Value Date   VD25OH 33.1 06/05/2018     Objective    BP 126/74   Pulse 75   Temp 98.2 F (36.8 C) (Oral)   Resp 17   Ht $R'5\' 5"'CH$  (1.651 m)   Wt 199 lb 3.2 oz (90.4 kg)   SpO2 97%   BMI 33.15 kg/m  BP Readings from Last 3 Encounters:  12/22/21 126/74  09/15/21 138/84  06/18/21 121/83   Wt Readings from Last 3 Encounters:  12/22/21 199 lb 3.2 oz (90.4 kg)  09/15/21 203  lb (92.1 kg)   06/18/21 194 lb (88 kg)    Physical Exam  General:  Pleasant and cooperative, appropriate for stated age.  Neuro:  Alert and oriented,  extra-ocular muscles intact  HEENT:  Normocephalic, atraumatic, neck supple  Skin:  no gross rash, warm, pink. Cardiac:  RRR, S1 S2 Respiratory: CTA B/L w/o crackles, rales, rhonchi or wheezing. Vascular:  Ext warm, no cyanosis apprec.; cap RF less 2 sec. Psych:  No HI/SI, judgement and insight good, Euthymic mood. Full Affect.   No results found for any visits on 12/22/21.  Assessment & Plan      Problem List Items Addressed This Visit       Digestive   GERD (gastroesophageal reflux disease)    -Discussed with patient cough and constant throat clearing can be associated with uncontrolled acid reflux, recommend trial of PPI therapy. Patient has been on H2 blocker therapy with famotidine. Patient is agreeable. Will send rx for pantoprazole 20 mg. If symptoms fail to improve or worsen despite PPI therapy then we can consider pulmonary etiology for symptoms and trial albuterol inhaler. Pt verbalized understanding.      Relevant Medications   pantoprazole (PROTONIX) 20 MG tablet   ondansetron (ZOFRAN) 4 MG tablet     Other   Depression (Chronic)    -Stable. PHQ-9 score of 0. Continue current medication regimen, see med list. Will continue to monitor.      Attention deficit hyperactivity disorder (ADHD) - Primary    -Patient previously on Vyvanse 70 mg which worked well but no longer covered by her insurance so switched to methylphenidate (Concerta), will trial max dose of 72 mg. Sent rx for methylphenidate 18 mg to take in addition to 54 mg daily in the morning. If increased dose more effective then I will send in additional refill/s. Follow-up in 3 months for medication management.       Relevant Medications   methylphenidate (CONCERTA) 18 MG PO CR tablet   Screening for breast cancer   Relevant Orders   MM Digital Screening   Class 1  obesity in adult   Relevant Medications   methylphenidate (CONCERTA) 18 MG PO CR tablet   Other Visit Diagnoses     Nausea       Relevant Medications   ondansetron (ZOFRAN) 4 MG tablet      Class 1 obesity in adult: -S/p gastric bypass sx.  -Patient has cardiovascular risk factors and would benefit from weight loss to reduce risk. Provided patient sample of Ozempic 0.25 mg [place] once a week and refill of Zofran for nausea. Encourage to continue weight loss efforts with diet and lifestyle interventions. Also recommend updating routine fasting labs in the near future.   Return in about 3 months (around 03/24/2022) for Mood, Wt, ADHD.        Lorrene Reid, PA-C  [facility] at [facility] [phone] (phone) [phone] (fax)  [place]

## 2021-12-22 NOTE — Assessment & Plan Note (Signed)
-  Discussed with patient cough and constant throat clearing can be associated with uncontrolled acid reflux, recommend trial of PPI therapy. Patient has been on H2 blocker therapy with famotidine. Patient is agreeable. Will send rx for pantoprazole 20 mg. If symptoms fail to improve or worsen despite PPI therapy then we can consider pulmonary etiology for symptoms and trial albuterol inhaler. Pt verbalized understanding.

## 2021-12-22 NOTE — Assessment & Plan Note (Addendum)
-  Patient previously on Vyvanse 70 mg which worked well but no longer covered by her insurance so switched to methylphenidate (Concerta), will trial max dose of 72 mg. Sent rx for methylphenidate 18 mg to take in addition to 54 mg daily in the morning. If increased dose more effective then I will send in additional refill/s. Follow-up in 3 months for medication management.

## 2022-01-04 ENCOUNTER — Telehealth: Payer: Self-pay | Admitting: Physician Assistant

## 2022-01-04 NOTE — Telephone Encounter (Signed)
Laurie Arnold Key: [id number] - Rx #: [id number] Need help? Call us at [phone] Outcome Approvedon August 11 Effective from 12/25/2021 through 12/24/2022. Drug Pantoprazole Sodium 20MG  dr tablets Form Blue Form (CB) Original Claim Info 75 DRUG REQUIRES PRIOR AUTHORIZATION  Per [employer] patients Pantoprazole should be covered from 12/25/21 until 12/24/22. If she is still having an issue getting this medication, I would suggest she reach out to the insurance company and have them communicate with her pharmacy.   In regard to the [facility]: It is currently on national back order for the starting doses. Per the [facility] representative, he has suggested we do not start anyone on Wegovy until the end of September/beginning of December.   Please inform patient of this information. AS, CMA

## 2022-01-04 NOTE — Telephone Encounter (Signed)
Unfortunately we do not have samples of the Ozempic at this time. Patient can call back next week to see if we have any samples.

## 2022-01-04 NOTE — Telephone Encounter (Signed)
Patient called pharmacy and they told her the pantoprazole is still not running through insurance can you check on that? Also pt was given samples of Ozempic and she is not diabetic and her insurance will not cover it but will cover Wegovy-pt asking if that can be called in for her?

## 2022-01-04 NOTE — Telephone Encounter (Signed)
She is asking if she can continue getting samples of Ozempic since cannot get [facility]

## 2022-01-04 NOTE — Telephone Encounter (Signed)
Patient aware.

## 2022-01-05 ENCOUNTER — Telehealth: Payer: Self-pay | Admitting: Physician Assistant

## 2022-01-05 DIAGNOSIS — F909 Attention-deficit hyperactivity disorder, unspecified type: Secondary | ICD-10-CM

## 2022-01-05 MED ORDER — METHYLPHENIDATE HCL ER (OSM) 54 MG PO TBCR: 54.0000 mg | EXTENDED_RELEASE_TABLET | ORAL | 0 refills | Status: DC

## 2022-01-05 MED ORDER — METHYLPHENIDATE HCL ER (OSM) 18 MG PO TBCR: 18.0000 mg | EXTENDED_RELEASE_TABLET | Freq: Every day | ORAL | 0 refills | Status: DC

## 2022-01-05 NOTE — Telephone Encounter (Signed)
Patient aware.

## 2022-01-05 NOTE — Telephone Encounter (Signed)
Patient requesting refill of the higher dose of concerta. Please advise.

## 2022-01-14 ENCOUNTER — Telehealth: Payer: Self-pay | Admitting: Physician Assistant

## 2022-01-14 NOTE — Telephone Encounter (Signed)
Patient states her Concerta was sent in for 54 mg and 18 mg and patient states they do make a 72 mg and she is having to pay double the money for both 54 & 18. She is asking if you can please change to 72 mg and send to pharmacy. [facility] [address] Please advise.

## 2022-01-14 NOTE — Telephone Encounter (Signed)
Patient called pharmacy and they refused to fill the both doses so she just wanted to let you know she will only be taking the 54mg .

## 2022-02-03 ENCOUNTER — Telehealth: Payer: Self-pay | Admitting: Physician Assistant

## 2022-02-03 NOTE — Telephone Encounter (Signed)
Patient wants to know if we have any samples of Ozempic she can come get? Please advise.

## 2022-02-03 NOTE — Telephone Encounter (Signed)
Patient aware.

## 2022-02-10 ENCOUNTER — Telehealth: Payer: Self-pay

## 2022-02-10 DIAGNOSIS — F909 Attention-deficit hyperactivity disorder, unspecified type: Secondary | ICD-10-CM

## 2022-02-10 MED ORDER — METHYLPHENIDATE HCL ER (OSM) 54 MG PO TBCR: 54.0000 mg | EXTENDED_RELEASE_TABLET | ORAL | 0 refills | Status: DC

## 2022-02-10 NOTE — Telephone Encounter (Signed)
No answer, left voicemail

## 2022-02-10 NOTE — Telephone Encounter (Signed)
Patient called office to see if she could get methylphenidate, please advise, thanks!

## 2022-02-12 NOTE — Telephone Encounter (Signed)
Called pt she is advised of her Rx 

## 2022-03-02 ENCOUNTER — Telehealth: Payer: Self-pay

## 2022-03-02 ENCOUNTER — Other Ambulatory Visit: Payer: Self-pay

## 2022-03-02 DIAGNOSIS — E6609 Other obesity due to excess calories: Secondary | ICD-10-CM

## 2022-03-02 MED ORDER — SEMAGLUTIDE (1 MG/DOSE) 4 MG/3ML ~~LOC~~ SOPN: 1.0000 mg | PEN_INJECTOR | SUBCUTANEOUS | 1 refills | Status: DC

## 2022-03-02 NOTE — Telephone Encounter (Signed)
Pt just called back stating that the pharmacy called her and that she will need a PA for the Ozempic.  Please advise

## 2022-03-02 NOTE — Telephone Encounter (Signed)
Patient is aware that we currently have no samples of ozempic and a prescription was sent to [facility] pharmacy today.

## 2022-03-02 NOTE — Telephone Encounter (Signed)
Pt is calling to see if she can get sample of Ozempic 5mg  is the current dose she is on but the last samples she received 2.5mg .  Please advise

## 2022-03-15 ENCOUNTER — Telehealth: Payer: Self-pay

## 2022-03-15 DIAGNOSIS — F909 Attention-deficit hyperactivity disorder, unspecified type: Secondary | ICD-10-CM

## 2022-03-15 MED ORDER — METHYLPHENIDATE HCL ER (OSM) 54 MG PO TBCR: 54.0000 mg | EXTENDED_RELEASE_TABLET | ORAL | 0 refills | Status: DC

## 2022-03-15 NOTE — Telephone Encounter (Signed)
Patient came into office to see if she could get a refill on her methyphenidate, and to see if there's any other medication that she could take other than medication other ozempic, patient would like to receive a call back and speak with Herb Grays, please advise, thanks!

## 2022-03-19 ENCOUNTER — Other Ambulatory Visit: Payer: Self-pay | Admitting: Physician Assistant

## 2022-03-19 DIAGNOSIS — E039 Hypothyroidism, unspecified: Secondary | ICD-10-CM

## 2022-03-24 ENCOUNTER — Other Ambulatory Visit: Payer: Self-pay

## 2022-03-24 DIAGNOSIS — E039 Hypothyroidism, unspecified: Secondary | ICD-10-CM

## 2022-03-24 DIAGNOSIS — E7800 Pure hypercholesterolemia, unspecified: Secondary | ICD-10-CM

## 2022-03-24 DIAGNOSIS — Z0000 Encounter for general adult medical examination without abnormal findings: Secondary | ICD-10-CM

## 2022-03-25 ENCOUNTER — Ambulatory Visit: Payer: BC Managed Care – PPO | Admitting: Physician Assistant

## 2022-03-26 ENCOUNTER — Other Ambulatory Visit: Payer: BC Managed Care – PPO

## 2022-03-26 ENCOUNTER — Ambulatory Visit: Payer: BC Managed Care – PPO | Admitting: Physician Assistant

## 2022-03-26 ENCOUNTER — Encounter: Payer: Self-pay | Admitting: Physician Assistant

## 2022-03-26 VITALS — BP 126/84 | HR 63 | Temp 98.0°F | Ht 65.0 in | Wt 181.0 lb

## 2022-03-26 DIAGNOSIS — E7800 Pure hypercholesterolemia, unspecified: Secondary | ICD-10-CM

## 2022-03-26 DIAGNOSIS — E6609 Other obesity due to excess calories: Secondary | ICD-10-CM

## 2022-03-26 DIAGNOSIS — F909 Attention-deficit hyperactivity disorder, unspecified type: Secondary | ICD-10-CM | POA: Diagnosis not present

## 2022-03-26 DIAGNOSIS — K219 Gastro-esophageal reflux disease without esophagitis: Secondary | ICD-10-CM

## 2022-03-26 DIAGNOSIS — E039 Hypothyroidism, unspecified: Secondary | ICD-10-CM

## 2022-03-26 DIAGNOSIS — Z0000 Encounter for general adult medical examination without abnormal findings: Secondary | ICD-10-CM | POA: Diagnosis not present

## 2022-03-26 DIAGNOSIS — Z01812 Encounter for preprocedural laboratory examination: Secondary | ICD-10-CM | POA: Diagnosis not present

## 2022-03-26 DIAGNOSIS — Z01818 Encounter for other preprocedural examination: Secondary | ICD-10-CM | POA: Diagnosis not present

## 2022-03-26 DIAGNOSIS — F339 Major depressive disorder, recurrent, unspecified: Secondary | ICD-10-CM

## 2022-03-26 DIAGNOSIS — G629 Polyneuropathy, unspecified: Secondary | ICD-10-CM

## 2022-03-26 DIAGNOSIS — Z6833 Body mass index (BMI) 33.0-33.9, adult: Secondary | ICD-10-CM

## 2022-03-26 MED ORDER — METHYLPHENIDATE HCL ER (OSM) 54 MG PO TBCR: 54.0000 mg | EXTENDED_RELEASE_TABLET | ORAL | 0 refills | Status: DC

## 2022-03-26 MED ORDER — BUPROPION HCL ER (XL) 300 MG PO TB24: 300.0000 mg | ORAL_TABLET | Freq: Every day | ORAL | 1 refills | Status: DC

## 2022-03-26 MED ORDER — FLUOXETINE HCL 40 MG PO CAPS: 40.0000 mg | ORAL_CAPSULE | Freq: Every day | ORAL | 1 refills | Status: DC

## 2022-03-26 MED ORDER — GABAPENTIN 600 MG PO TABS: 600.0000 mg | ORAL_TABLET | Freq: Two times a day (BID) | ORAL | 1 refills | Status: DC

## 2022-03-26 MED ORDER — PANTOPRAZOLE SODIUM 20 MG PO TBEC: 20.0000 mg | DELAYED_RELEASE_TABLET | Freq: Every day | ORAL | 1 refills | Status: DC

## 2022-03-26 MED ORDER — BUSPIRONE HCL 10 MG PO TABS: 10.0000 mg | ORAL_TABLET | Freq: Every day | ORAL | 1 refills | Status: DC

## 2022-03-26 NOTE — Assessment & Plan Note (Signed)
-  Last TSH wnl at 1.690. -Continue current medication regimen. -Rechecking thyroid labs today. Pending results will make medication adjustments if indicated.

## 2022-03-26 NOTE — Assessment & Plan Note (Signed)
-  Not very well controlled. Will increase Gabapentin 600 mg to twice daily.

## 2022-03-26 NOTE — Assessment & Plan Note (Signed)
-  Praised patient for weight loss. Encourage to continue efforts with diet and lifestyle interventions.

## 2022-03-26 NOTE — Progress Notes (Signed)
Established patient visit   Patient: Laurie Arnold   DOB: 12-17-1965   56 y.o. Female  MRN: [id number] Visit Date: 03/26/2022  Chief Complaint  Patient presents with   Pre-op Exam   Subjective    HPI  Patient presents for presurgical clearance. Patient is scheduled for cosmetic surgery (abdominoplasty) 05/19/2022. Patient has a past medical history of hyperlipidemia, hypothyroidism, neuropathy, depression, ADHD, GERD and obesity. No chest pain, palpitations, shortness of breath, dizziness, dysuria, or syncope. Patient has lost 18 pounds with diet and lifestyle changes. Denies prior surgical complications or issues with anesthesia. Patient reports pantoprazole has helped with acid reflux and throat symptoms. Taking Gabapentin 600 mg at bedtime but reports her lower extremity nerve pain is sometimes not well controlled.   Medications: Outpatient Medications Prior to Visit  Medication Sig   Acetaminophen (TYLENOL ARTHRITIS PAIN PO) Take by mouth as needed.   cetirizine (ZYRTEC) 10 MG tablet Take 1 tablet by mouth daily.   famotidine (PEPCID) 20 MG tablet Take 20 mg by mouth daily.    hydrocortisone (ANUSOL-HC) 2.5 % rectal cream Place 1 application rectally 2 (two) times daily.   hydrocortisone (ANUSOL-HC) 25 MG suppository Place 1 suppository (25 mg total) rectally 2 (two) times daily.   levothyroxine (SYNTHROID) 88 MCG tablet TAKE 1 TABLET BY MOUTH EVERY DAY   ondansetron (ZOFRAN) 4 MG tablet Take 1 tablet (4 mg total) by mouth every 8 (eight) hours as needed for nausea or vomiting.   [DISCONTINUED] buPROPion (WELLBUTRIN XL) 300 MG 24 hr tablet Take 1 tablet (300 mg total) by mouth daily.   [DISCONTINUED] busPIRone (BUSPAR) 10 MG tablet Take 1 tablet (10 mg total) by mouth 3 (three) times daily. (Patient taking differently: Take 10 mg by mouth 3 (three) times daily. 1 time daily avg)   [DISCONTINUED] FLUoxetine (PROZAC) 40 MG capsule TAKE 1 CAPSULE (40 MG TOTAL) BY MOUTH DAILY.    [DISCONTINUED] gabapentin (NEURONTIN) 600 MG tablet Take 1 tablet (600 mg total) by mouth at bedtime.   [DISCONTINUED] methylphenidate (CONCERTA) 54 MG PO CR tablet Take 1 tablet (54 mg total) by mouth every morning.   [DISCONTINUED] pantoprazole (PROTONIX) 20 MG tablet Take 1 tablet (20 mg total) by mouth daily.   [DISCONTINUED] Semaglutide, 1 MG/DOSE, 4 MG/3ML SOPN Inject 1 mg as directed once a week.   No facility-administered medications prior to visit.    Review of Systems Review of Systems:  A fourteen system review of systems was performed and found to be positive as per HPI.     03/26/2022   10:29 AM 12/22/2021    4:04 PM 09/15/2021    3:59 PM 06/18/2021    9:05 AM 04/08/2021    4:16 PM  Depression screen PHQ 2/9  Decreased Interest 0 0 0 0 0  Down, Depressed, Hopeless 0 0 0 0 0  PHQ - 2 Score 0 0 0 0 0  Altered sleeping 0 0 0 1 0  Tired, decreased energy  0 0 0 1  Change in appetite 0 0 0 0 0  Feeling bad or failure about yourself  0 0 0 0 0  Trouble concentrating 0 0 0 0 0  Moving slowly or fidgety/restless 0 0 0 0 0  Suicidal thoughts 0 0 0 0 0  PHQ-9 Score 0 0 0 1 1  Difficult doing work/chores Not difficult at all  Not difficult at all Not difficult at all Not difficult at all      03/26/2022   10:29 AM  09/15/2021    4:00 PM 06/18/2021    9:05 AM 04/08/2021    4:16 PM  GAD 7 : Generalized Anxiety Score  Nervous, Anxious, on Edge 0 0 0 0  Control/stop worrying 0 0 0 0  Worry too much - different things 0 0 0 0  Trouble relaxing 0 0 0 0  Restless 0 0 0 0  Easily annoyed or irritable 0 0 0 0  Afraid - awful might happen 0 0 0 0  Total GAD 7 Score 0 0 0 0  Anxiety Difficulty Not difficult at all Not difficult at all Not difficult at all Not difficult at all     Last CBC Lab Results  Component Value Date   WBC 6.0 09/12/2020   HGB 15.2 09/12/2020   HCT 45.1 09/12/2020   MCV 92 09/12/2020   MCH 30.8 09/12/2020   RDW 13.7 09/12/2020   PLT 327 38/46/6599    Last metabolic panel Lab Results  Component Value Date   GLUCOSE 103 (H) 09/12/2020   NA 146 (H) 09/12/2020   K 4.7 09/12/2020   CL 106 09/12/2020   CO2 21 09/12/2020   BUN 15 09/12/2020   CREATININE 0.80 09/12/2020   EGFR 89 09/12/2020   CALCIUM 8.9 09/12/2020   PROT 7.1 09/12/2020   ALBUMIN 4.4 09/12/2020   LABGLOB 2.7 09/12/2020   AGRATIO 1.6 09/12/2020   BILITOT <0.2 09/12/2020   ALKPHOS 96 09/12/2020   AST 25 09/12/2020   ALT 23 09/12/2020   ANIONGAP 12 02/27/2018   Last lipids Lab Results  Component Value Date   CHOL 293 (H) 09/12/2020   HDL 64 09/12/2020   LDLCALC 198 (H) 09/12/2020   TRIG 165 (H) 09/12/2020   CHOLHDL 4.4 02/28/2019   Last hemoglobin A1c Lab Results  Component Value Date   HGBA1C 5.6 02/28/2019   Last thyroid functions Lab Results  Component Value Date   TSH 1.690 09/12/2020   T3TOTAL 95 12/09/2017   Last vitamin D Lab Results  Component Value Date   VD25OH 33.1 06/05/2018       Objective    BP 126/84 (BP Location: Right Arm, Patient Position: Sitting, Cuff Size: Large)   Pulse 63   Temp 98 F (36.7 C) (Temporal)   Ht _0  (1.651 m)   Wt 181 lb (82.1 kg)   SpO2 97%   BMI 30.12 kg/m  BP Readings from Last 3 Encounters:  03/26/22 126/84  12/22/21 126/74  09/15/21 138/84   Wt Readings from Last 3 Encounters:  03/26/22 181 lb (82.1 kg)  12/22/21 199 lb 3.2 oz (90.4 kg)  09/15/21 203 lb (92.1 kg)    Physical Exam Constitutional:      General: She is not in acute distress.    Appearance: She is not toxic-appearing.  HENT:     Head: Normocephalic and atraumatic.     Right Ear: Tympanic membrane and external ear normal.     Left Ear: Tympanic membrane and external ear normal.     Nose: Nose normal.     Mouth/Throat:     Mouth: Mucous membranes are moist.     Pharynx: Oropharynx is clear. No oropharyngeal exudate or posterior oropharyngeal erythema.  Eyes:     Conjunctiva/sclera: Conjunctivae normal.     Pupils:  Pupils are equal, round, and reactive to light.  Cardiovascular:     Rate and Rhythm: Normal rate and regular rhythm.     Pulses: Normal pulses.     Heart  sounds: Normal heart sounds.  Pulmonary:     Effort: Pulmonary effort is normal. No respiratory distress.     Breath sounds: Normal breath sounds. No wheezing.  Abdominal:     General: Abdomen is flat. There is no distension.     Palpations: Abdomen is soft.     Tenderness: There is no abdominal tenderness. There is no guarding.  Musculoskeletal:        General: Normal range of motion.     Cervical back: Normal range of motion and neck supple.  Skin:    General: Skin is warm.     Capillary Refill: Capillary refill takes less than 2 seconds.     Findings: No bruising or rash.  Neurological:     General: No focal deficit present.     Mental Status: She is alert.  Psychiatric:        Mood and Affect: Mood normal.        Behavior: Behavior normal.       No results found for any visits on 03/26/22.  Assessment & Plan      Problem List Items Addressed This Visit       Digestive   GERD (gastroesophageal reflux disease)    -Improved with pantoprazole 20 mg daily, recommend to continue. In the future if symptoms remain stable, recommend taking medication on demand vs daily.       Relevant Medications   pantoprazole (PROTONIX) 20 MG tablet     Endocrine   Hypothyroidism    -Last TSH wnl at 1.690. -Continue current medication regimen. -Rechecking thyroid labs today. Pending results will make medication adjustments if indicated.         Nervous and Auditory   Neuropathy    -Not very well controlled. Will increase Gabapentin 600 mg to twice daily.      Relevant Medications   gabapentin (NEURONTIN) 600 MG tablet     Other   Depression (Chronic)   Relevant Medications   buPROPion (WELLBUTRIN XL) 300 MG 24 hr tablet   busPIRone (BUSPAR) 10 MG tablet   FLUoxetine (PROZAC) 40 MG capsule   Attention deficit  hyperactivity disorder (ADHD)   Relevant Medications   methylphenidate (CONCERTA) 54 MG PO CR tablet (Start on 06/13/2022)   Elevated LDL cholesterol level    -Will repeat lipid panel today. Last LDL 165. Encourage to continue weight loss efforts with diet changes including low fat diet. -The 10-year ASCVD risk score (Arnett DK, et al., 2019) is: 2.3%       Class 1 obesity in adult    -Praised patient for weight loss. Encourage to continue efforts with diet and lifestyle interventions.       Relevant Medications   methylphenidate (CONCERTA) 54 MG PO CR tablet (Start on 06/13/2022)   methylphenidate 54 MG PO CR tablet (Start on 04/13/2022)   methylphenidate 54 MG PO CR tablet (Start on 05/13/2022)   Other Visit Diagnoses     Preoperative clearance    -  Primary   Relevant Orders   EKG 12-Lead      Preoperative clearance: -Will collect request pre-op labs. -EKG collected: sinus bradycardia, rate 55 bpm, no acute ST-T wave changes. No prior EKG to compare. Pt denies cardiac symptoms. -Vitals signs and physical exam normal.  -If pending lab results unremarkable, patient will be medically cleared for surgery.   ADHD: -Chronic, stable. Will continue methylphenidate 54 mg daily. Follow-up in 3 months for medication management.     Return in about  3 months (around 06/26/2022) for mood, med management/ADHD, GERD.        Lorrene Reid, PA-C  [facility] at [facility] [phone] (phone) [phone] (fax)  [place]

## 2022-03-26 NOTE — Assessment & Plan Note (Signed)
>>  ASSESSMENT AND PLAN FOR CLASS 1 OBESITY IN ADULT WRITTEN ON 03/26/2022  3:14 PM BY ABONZA, MARITZA, PA-C  -Praised patient for weight loss. Encourage to continue efforts with diet and lifestyle interventions.

## 2022-03-26 NOTE — Assessment & Plan Note (Signed)
-  Improved with pantoprazole 20 mg daily, recommend to continue. In the future if symptoms remain stable, recommend taking medication on demand vs daily.

## 2022-03-26 NOTE — Assessment & Plan Note (Signed)
-  Will repeat lipid panel today. Last LDL 165. Encourage to continue weight loss efforts with diet changes including low fat diet. -The 10-year ASCVD risk score (Arnett DK, et al., 2019) is: 2.3%

## 2022-03-27 LAB — PROTIME-INR
INR: 1 (ref 0.9–1.2)
Prothrombin Time: 10.5 s (ref 9.1–12.0)

## 2022-03-27 LAB — APTT: aPTT: 26 s (ref 24–33)

## 2022-04-01 LAB — CBC WITH DIFFERENTIAL/PLATELET
Basophils Absolute: 0.1 10*3/uL (ref 0.0–0.2)
Basos: 2 %
EOS (ABSOLUTE): 0.3 10*3/uL (ref 0.0–0.4)
Eos: 6 %
Hematocrit: 39.2 % (ref 34.0–46.6)
Hemoglobin: 13.5 g/dL (ref 11.1–15.9)
Immature Grans (Abs): 0 10*3/uL (ref 0.0–0.1)
Immature Granulocytes: 0 %
Lymphocytes Absolute: 1.4 10*3/uL (ref 0.7–3.1)
Lymphs: 26 %
MCH: 31.5 pg (ref 26.6–33.0)
MCHC: 34.4 g/dL (ref 31.5–35.7)
MCV: 91 fL (ref 79–97)
Monocytes Absolute: 0.5 10*3/uL (ref 0.1–0.9)
Monocytes: 9 %
Neutrophils Absolute: 3 10*3/uL (ref 1.4–7.0)
Neutrophils: 57 %
Platelets: 317 10*3/uL (ref 150–450)
RBC: 4.29 x10E6/uL (ref 3.77–5.28)
RDW: 13.1 % (ref 11.7–15.4)
WBC: 5.4 10*3/uL (ref 3.4–10.8)

## 2022-04-01 LAB — FACTOR 5 LEIDEN

## 2022-04-01 LAB — LIPID PANEL
Chol/HDL Ratio: 3.1 ratio (ref 0.0–4.4)
Cholesterol, Total: 253 mg/dL — ABNORMAL HIGH (ref 100–199)
HDL: 81 mg/dL (ref >39)
LDL Chol Calc (NIH): 156 mg/dL — ABNORMAL HIGH (ref 0–99)
Triglycerides: 96 mg/dL (ref 0–149)
VLDL Cholesterol Cal: 16 mg/dL (ref 5–40)

## 2022-04-01 LAB — COMPREHENSIVE METABOLIC PANEL
ALT: 24 IU/L (ref 0–32)
AST: 32 IU/L (ref 0–40)
Albumin/Globulin Ratio: 1.6 (ref 1.2–2.2)
Albumin: 4.6 g/dL (ref 3.8–4.9)
Alkaline Phosphatase: 78 IU/L (ref 44–121)
BUN/Creatinine Ratio: 22 (ref 9–23)
BUN: 15 mg/dL (ref 6–24)
Bilirubin Total: 0.5 mg/dL (ref 0.0–1.2)
CO2: 22 mmol/L (ref 20–29)
Calcium: 9.8 mg/dL (ref 8.7–10.2)
Chloride: 106 mmol/L (ref 96–106)
Creatinine, Ser: 0.69 mg/dL (ref 0.57–1.00)
Globulin, Total: 2.8 g/dL (ref 1.5–4.5)
Glucose: 104 mg/dL — ABNORMAL HIGH (ref 70–99)
Potassium: 4 mmol/L (ref 3.5–5.2)
Sodium: 143 mmol/L (ref 134–144)
Total Protein: 7.4 g/dL (ref 6.0–8.5)
eGFR: 103 mL/min/{1.73_m2} (ref >59)

## 2022-04-01 LAB — HEMOGLOBIN A1C
Est. average glucose Bld gHb Est-mCnc: 103 mg/dL
Hgb A1c MFr Bld: 5.2 % (ref 4.8–5.6)

## 2022-04-01 LAB — T3: T3, Total: 83 ng/dL (ref 71–180)

## 2022-04-01 LAB — TSH: TSH: 0.313 u[IU]/mL — ABNORMAL LOW (ref 0.450–4.500)

## 2022-04-01 LAB — T4: T4, Total: 6.8 ug/dL (ref 4.5–12.0)

## 2022-04-02 ENCOUNTER — Telehealth: Payer: Self-pay

## 2022-04-02 NOTE — Telephone Encounter (Signed)
Called patient she is advised of her lab results and recommendation pt stated that she will contact office after her surgery for her f/u labs for her thyroid

## 2022-04-02 NOTE — Telephone Encounter (Signed)
-----   Message from Mayer Masker, [place] sent at 04/01/2022  4:58 PM EST ----- Please call Ms. Baris and notify most labs are normal. Bad cholesterol remains elevated but has reduced from 198 to 156. Triglycerides have reduced from 165 to 96. TSH is mildly low but T4 and T3 are normal so recommend repeating thyroid labs in 4 weeks to evaluate if levothyroxine needs to be adjusted.   Thank you, Kandis Cocking

## 2022-04-02 NOTE — Telephone Encounter (Signed)
-----   Message from Maritza Abonza, PA-C sent at 04/01/2022  4:58 PM EST ----- Please call Ms. Ovando and notify most labs are normal. Bad cholesterol remains elevated but has reduced from 198 to 156. Triglycerides have reduced from 165 to 96. TSH is mildly low but T4 and T3 are normal so recommend repeating thyroid labs in 4 weeks to evaluate if levothyroxine needs to be adjusted.   Thank you, Maritza 

## 2022-05-27 DIAGNOSIS — E860 Dehydration: Secondary | ICD-10-CM | POA: Diagnosis not present

## 2022-05-27 DIAGNOSIS — F39 Unspecified mood [affective] disorder: Secondary | ICD-10-CM | POA: Diagnosis not present

## 2022-05-27 DIAGNOSIS — E876 Hypokalemia: Secondary | ICD-10-CM | POA: Diagnosis not present

## 2022-05-27 DIAGNOSIS — Z91198 Patient's noncompliance with other medical treatment and regimen for other reason: Secondary | ICD-10-CM | POA: Diagnosis not present

## 2022-05-27 DIAGNOSIS — L089 Local infection of the skin and subcutaneous tissue, unspecified: Secondary | ICD-10-CM | POA: Diagnosis not present

## 2022-05-27 DIAGNOSIS — Z9889 Other specified postprocedural states: Secondary | ICD-10-CM | POA: Diagnosis not present

## 2022-05-27 DIAGNOSIS — L03114 Cellulitis of left upper limb: Secondary | ICD-10-CM | POA: Diagnosis not present

## 2022-05-27 DIAGNOSIS — E039 Hypothyroidism, unspecified: Secondary | ICD-10-CM | POA: Diagnosis not present

## 2022-05-27 DIAGNOSIS — D638 Anemia in other chronic diseases classified elsewhere: Secondary | ICD-10-CM | POA: Diagnosis not present

## 2022-05-27 DIAGNOSIS — Z6829 Body mass index (BMI) 29.0-29.9, adult: Secondary | ICD-10-CM | POA: Diagnosis not present

## 2022-05-27 DIAGNOSIS — N179 Acute kidney failure, unspecified: Secondary | ICD-10-CM | POA: Diagnosis not present

## 2022-05-27 DIAGNOSIS — R0789 Other chest pain: Secondary | ICD-10-CM | POA: Diagnosis not present

## 2022-05-27 DIAGNOSIS — Z7409 Other reduced mobility: Secondary | ICD-10-CM | POA: Diagnosis not present

## 2022-05-27 DIAGNOSIS — Z79899 Other long term (current) drug therapy: Secondary | ICD-10-CM | POA: Diagnosis not present

## 2022-05-27 DIAGNOSIS — T148XXA Other injury of unspecified body region, initial encounter: Secondary | ICD-10-CM | POA: Diagnosis not present

## 2022-05-27 DIAGNOSIS — Z7901 Long term (current) use of anticoagulants: Secondary | ICD-10-CM | POA: Diagnosis not present

## 2022-05-27 DIAGNOSIS — L03113 Cellulitis of right upper limb: Secondary | ICD-10-CM | POA: Diagnosis not present

## 2022-05-27 DIAGNOSIS — Z79890 Hormone replacement therapy (postmenopausal): Secondary | ICD-10-CM | POA: Diagnosis not present

## 2022-05-27 DIAGNOSIS — Z5982 Transportation insecurity: Secondary | ICD-10-CM | POA: Diagnosis not present

## 2022-05-27 DIAGNOSIS — M25512 Pain in left shoulder: Secondary | ICD-10-CM | POA: Diagnosis not present

## 2022-05-27 DIAGNOSIS — E663 Overweight: Secondary | ICD-10-CM | POA: Diagnosis not present

## 2022-05-27 DIAGNOSIS — Z9884 Bariatric surgery status: Secondary | ICD-10-CM | POA: Diagnosis not present

## 2022-05-27 DIAGNOSIS — A419 Sepsis, unspecified organism: Secondary | ICD-10-CM | POA: Diagnosis not present

## 2022-05-27 DIAGNOSIS — B965 Pseudomonas (aeruginosa) (mallei) (pseudomallei) as the cause of diseases classified elsewhere: Secondary | ICD-10-CM | POA: Diagnosis not present

## 2022-05-28 DIAGNOSIS — L089 Local infection of the skin and subcutaneous tissue, unspecified: Secondary | ICD-10-CM | POA: Diagnosis not present

## 2022-06-07 ENCOUNTER — Telehealth: Payer: Self-pay

## 2022-06-07 NOTE — Telephone Encounter (Signed)
Transition Care Management Follow-up Telephone Call Date of discharge and from where: [facility] 06/04/2022 How have you been since you were released from the hospital? weak Any questions or concerns? No  Items Reviewed: Did the pt receive and understand the discharge instructions provided? Yes  Medications obtained and verified? Yes  Other? No  Any new allergies since your discharge? No  Dietary orders reviewed? Yes Do you have support at home? Yes   Home Care and Equipment/Supplies: Were home health services ordered? no If so, what is the name of the agency? N/a  Has the agency set up a time to come to the patient's home? no Were any new equipment or medical supplies ordered?  No What is the name of the medical supply agency? N/a Were you able to get the supplies/equipment? no Do you have any questions related to the use of the equipment or supplies? No  Functional Questionnaire: (I = Independent and D = Dependent) ADLs: I  Bathing/Dressing- I  Meal Prep- I  Eating- I  Maintaining continence- I  Transferring/Ambulation- I  Managing Meds- I  Follow up appointments reviewed:  PCP Hospital f/u appt confirmed? No  Patient declined, she states will follow up with her [facility] f/u appt confirmed? Yes   Are transportation arrangements needed? No  If their condition worsens, is the pt aware to call PCP or go to the Emergency Dept.? Yes Was the patient provided with contact information for the PCP's office or ED? Yes Was to pt encouraged to call back with questions or concerns? Yes Juanda Crumble, LPN [place] [facility] [phone]

## 2022-07-12 ENCOUNTER — Encounter: Payer: Self-pay | Admitting: Family Medicine

## 2022-07-12 ENCOUNTER — Ambulatory Visit: Payer: BC Managed Care – PPO | Admitting: Family Medicine

## 2022-07-12 VITALS — BP 127/86 | HR 91 | Resp 18 | Ht 65.0 in | Wt 151.0 lb

## 2022-07-12 DIAGNOSIS — K219 Gastro-esophageal reflux disease without esophagitis: Secondary | ICD-10-CM | POA: Diagnosis not present

## 2022-07-12 DIAGNOSIS — F339 Major depressive disorder, recurrent, unspecified: Secondary | ICD-10-CM | POA: Diagnosis not present

## 2022-07-12 DIAGNOSIS — F909 Attention-deficit hyperactivity disorder, unspecified type: Secondary | ICD-10-CM | POA: Diagnosis not present

## 2022-07-12 DIAGNOSIS — E039 Hypothyroidism, unspecified: Secondary | ICD-10-CM

## 2022-07-12 DIAGNOSIS — F32A Depression, unspecified: Secondary | ICD-10-CM

## 2022-07-12 MED ORDER — BUPROPION HCL ER (XL) 300 MG PO TB24: 300.0000 mg | ORAL_TABLET | Freq: Every day | ORAL | 1 refills | Status: DC

## 2022-07-12 MED ORDER — PANTOPRAZOLE SODIUM 20 MG PO TBEC: 20.0000 mg | DELAYED_RELEASE_TABLET | Freq: Every day | ORAL | 1 refills | Status: DC

## 2022-07-12 MED ORDER — LEVOTHYROXINE SODIUM 88 MCG PO TABS: 88.0000 ug | ORAL_TABLET | Freq: Every day | ORAL | 1 refills | Status: DC

## 2022-07-12 MED ORDER — BUSPIRONE HCL 10 MG PO TABS: 10.0000 mg | ORAL_TABLET | Freq: Every day | ORAL | 1 refills | Status: DC

## 2022-07-12 MED ORDER — FLUOXETINE HCL 40 MG PO CAPS: 40.0000 mg | ORAL_CAPSULE | Freq: Every day | ORAL | 1 refills | Status: DC

## 2022-07-12 MED ORDER — METHYLPHENIDATE HCL ER (OSM) 54 MG PO TBCR: 54.0000 mg | EXTENDED_RELEASE_TABLET | ORAL | 0 refills | Status: DC

## 2022-07-12 NOTE — Progress Notes (Signed)
Established Patient Office Visit  Subjective   Patient ID: Laurie Arnold, female    DOB: [birth year]  Age: 57 y.o. MRN: [id number]  Chief Complaint  Patient presents with   ADHD   mood   Gastroesophageal Reflux    HPI Laurie Arnold is a 57 y.o. female presenting today for follow up of depression, ADHD, GERD. Mood: Patient is here to follow up for depression. she is currently managing with Prozac, BuSpar, Wellbutrin. Taking medication without side effects, reports excellent compliance with treatment. Denies mood changes or SI/HI. she feels mood is stable since last visit. Denies chest pain, difficulty concentrating, dizziness, fatigue, insomnia, irritability, palpitations, panic attacks, racing thoughts, SOB, sweating. Denies anhedonia, depressed mood, difficulty concentrating, fatigue, feelings of worthlessness/guilt, hopelessness, hypersomnia, impaired memory, insomnia, psychomotor agitation, psychomotor retardation, recurrent thoughts of death, weight changes     Jul 25, 2022    4:21 PM 03/26/2022   10:29 AM 12/22/2021    4:04 PM  Depression screen PHQ 2/9  Decreased Interest 0 0 0  Down, Depressed, Hopeless 0 0 0  PHQ - 2 Score 0 0 0  Altered sleeping 0 0 0  Tired, decreased energy 0  0  Change in appetite 0 0 0  Feeling bad or failure about yourself  0 0 0  Trouble concentrating 0 0 0  Moving slowly or fidgety/restless 0 0 0  Suicidal thoughts 0 0 0  PHQ-9 Score 0 0 0  Difficult doing work/chores Not difficult at all Not difficult at all        07/25/22    4:21 PM 03/26/2022   10:29 AM 09/15/2021    4:00 PM 06/18/2021    9:05 AM  GAD 7 : Generalized Anxiety Score  Nervous, Anxious, on Edge 0 0 0 0  Control/stop worrying 0 0 0 0  Worry too much - different things 0 0 0 0  Trouble relaxing 0 0 0 0  Restless 0 0 0 0  Easily annoyed or irritable 0 0 0 0  Afraid - awful might happen 0 0 0 0  Total GAD 7 Score 0 0 0 0  Anxiety Difficulty Not difficult at all Not difficult  at all Not difficult at all Not difficult at all    ADHD: Reports symptoms are well controlled on Concerta 54 mg p.o every a.m. Denies any problems with insomnia, chest pain, palpitations, or SOB.   GERD: Patient currently taking pantoprazole 20 mg, is not having side effects. she reports excellent compliance with treatment. Denies heartburn, dysphagia, nausea/vomiting, diarrhea, constipation, abdominal pain, chest pain, cough, early satiety, hematochezia/melena.   ROS Negative unless otherwise noted in HPI   Objective:     BP 127/86 (BP Location: Right Arm, Patient Position: Sitting, Cuff Size: Normal)   Pulse 91   Resp 18   Ht '5\' 5"'$  (1.651 m)   Wt 151 lb (68.5 kg)   SpO2 98%   BMI 25.13 kg/m   Physical Exam Constitutional:      General: She is not in acute distress.    Appearance: Normal appearance.  HENT:     Head: Normocephalic and atraumatic.  Cardiovascular:     Rate and Rhythm: Normal rate and regular rhythm.     Pulses: Normal pulses.     Heart sounds: No murmur heard.    No friction rub. No gallop.  Pulmonary:     Effort: Pulmonary effort is normal. No respiratory distress.     Breath sounds: No wheezing, rhonchi or rales.  Skin:    General: Skin is warm and dry.  Neurological:     Mental Status: She is alert and oriented to person, place, and time.     Assessment & Plan:  Gastroesophageal reflux disease, unspecified whether esophagitis present Assessment & Plan: Stable.  Continue pantoprazole 20 mg daily.  Will continue to monitor.  Orders: -     Pantoprazole Sodium; Take 1 tablet (20 mg total) by mouth daily.  Dispense: 90 tablet; Refill: 1  Attention deficit hyperactivity disorder (ADHD), unspecified ADHD type Assessment & Plan: Stable, well-controlled on Concerta 54 mg daily.  Will continue to monitor.  She will let me know if she is ever unhappy with the Concerta and wants to try 1 more medication to meet her insurance's criteria of failing 3 before  she is approved to go back on Vyvanse.  Orders: -     Methylphenidate HCl ER (OSM); Take 1 tablet (54 mg total) by mouth every morning.  Dispense: 30 tablet; Refill: 0 -     Methylphenidate HCl ER (OSM); Take 1 tablet (54 mg total) by mouth every morning.  Dispense: 30 tablet; Refill: 0 -     Methylphenidate HCl ER (OSM); Take 1 tablet (54 mg total) by mouth every morning.  Dispense: 30 tablet; Refill: 0  Depression, unspecified depression type Assessment & Plan: Continue Prozac 40 mg daily, Wellbutrin 300 mg daily, BuSpar 10 mg daily.  Will continue to monitor.  We will get labs at her next appointment in 3 months.   Recurrent major depressive disorder, remission status unspecified (HCC) Assessment & Plan: Continue Prozac 40 mg daily, Wellbutrin 300 mg daily, BuSpar 10 mg daily.  Will continue to monitor.  We will get labs at her next appointment in 3 months.  Orders: -     busPIRone HCl; Take 1 tablet (10 mg total) by mouth daily.  Dispense: 90 tablet; Refill: 1 -     FLUoxetine HCl; Take 1 capsule (40 mg total) by mouth daily.  Dispense: 90 capsule; Refill: 1 -     buPROPion HCl ER (XL); Take 1 tablet (300 mg total) by mouth daily.  Dispense: 90 tablet; Refill: 1  Hypothyroidism, unspecified type Assessment & Plan: Stable.  Continue levothyroxine 88 mcg.  Will draw labs at next appointment in 3 months.  Orders: -     Levothyroxine Sodium; Take 1 tablet (88 mcg total) by mouth daily.  Dispense: 90 tablet; Refill: 1  Return in about 3 months (around 10/10/2022) for follow-up for mood, ADHD, fasting blood work.    Velva Harman, PA

## 2022-07-12 NOTE — Assessment & Plan Note (Addendum)
Continue Prozac 40 mg daily, Wellbutrin 300 mg daily, BuSpar 10 mg daily.  Will continue to monitor.  We will get labs at her next appointment in 3 months.

## 2022-07-12 NOTE — Assessment & Plan Note (Signed)
Stable.  Continue levothyroxine 88 mcg.  Will draw labs at next appointment in 3 months.

## 2022-07-12 NOTE — Assessment & Plan Note (Signed)
Stable.  Continue pantoprazole 20 mg daily.  Will continue to monitor.

## 2022-07-12 NOTE — Assessment & Plan Note (Addendum)
Stable, well-controlled on Concerta 54 mg daily.  Will continue to monitor.  She will let me know if she is ever unhappy with the Concerta and wants to try 1 more medication to meet her insurance's criteria of failing 3 before she is approved to go back on Vyvanse.

## 2022-09-22 ENCOUNTER — Encounter (HOSPITAL_BASED_OUTPATIENT_CLINIC_OR_DEPARTMENT_OTHER): Payer: Self-pay | Admitting: Emergency Medicine

## 2022-09-22 ENCOUNTER — Emergency Department (HOSPITAL_BASED_OUTPATIENT_CLINIC_OR_DEPARTMENT_OTHER): Payer: BC Managed Care – PPO

## 2022-09-22 ENCOUNTER — Emergency Department (HOSPITAL_BASED_OUTPATIENT_CLINIC_OR_DEPARTMENT_OTHER)
Admission: EM | Admit: 2022-09-22 | Discharge: 2022-09-22 | Disposition: A | Discharge status: Home / Self Care | Payer: BC Managed Care – PPO | Attending: Emergency Medicine | Admitting: Emergency Medicine

## 2022-09-22 DIAGNOSIS — K219 Gastro-esophageal reflux disease without esophagitis: Secondary | ICD-10-CM | POA: Diagnosis not present

## 2022-09-22 DIAGNOSIS — Z87891 Personal history of nicotine dependence: Secondary | ICD-10-CM | POA: Insufficient documentation

## 2022-09-22 DIAGNOSIS — R1013 Epigastric pain: Secondary | ICD-10-CM | POA: Insufficient documentation

## 2022-09-22 DIAGNOSIS — R0789 Other chest pain: Secondary | ICD-10-CM | POA: Diagnosis not present

## 2022-09-22 DIAGNOSIS — R079 Chest pain, unspecified: Secondary | ICD-10-CM | POA: Diagnosis not present

## 2022-09-22 DIAGNOSIS — R112 Nausea with vomiting, unspecified: Secondary | ICD-10-CM | POA: Diagnosis not present

## 2022-09-22 DIAGNOSIS — Z7982 Long term (current) use of aspirin: Secondary | ICD-10-CM | POA: Diagnosis not present

## 2022-09-22 DIAGNOSIS — R61 Generalized hyperhidrosis: Secondary | ICD-10-CM | POA: Diagnosis not present

## 2022-09-22 DIAGNOSIS — Z79899 Other long term (current) drug therapy: Secondary | ICD-10-CM | POA: Diagnosis not present

## 2022-09-22 DIAGNOSIS — E039 Hypothyroidism, unspecified: Secondary | ICD-10-CM | POA: Insufficient documentation

## 2022-09-22 LAB — CBC WITH DIFFERENTIAL/PLATELET
Abs Immature Granulocytes: 0.01 10*3/uL (ref 0.00–0.07)
Basophils Absolute: 0.1 10*3/uL (ref 0.0–0.1)
Basophils Relative: 2 %
Eosinophils Absolute: 0.4 10*3/uL (ref 0.0–0.5)
Eosinophils Relative: 7 %
HCT: 40.8 % (ref 36.0–46.0)
Hemoglobin: 13.4 g/dL (ref 12.0–15.0)
Immature Granulocytes: 0 %
Lymphocytes Relative: 32 %
Lymphs Abs: 1.9 10*3/uL (ref 0.7–4.0)
MCH: 29.6 pg (ref 26.0–34.0)
MCHC: 32.8 g/dL (ref 30.0–36.0)
MCV: 90.1 fL (ref 80.0–100.0)
Monocytes Absolute: 0.6 10*3/uL (ref 0.1–1.0)
Monocytes Relative: 11 %
Neutro Abs: 2.9 10*3/uL (ref 1.7–7.7)
Neutrophils Relative %: 48 %
Platelets: 314 10*3/uL (ref 150–400)
RBC: 4.53 MIL/uL (ref 3.87–5.11)
RDW: 13.5 % (ref 11.5–15.5)
WBC: 5.9 10*3/uL (ref 4.0–10.5)
nRBC: 0 % (ref 0.0–0.2)

## 2022-09-22 LAB — COMPREHENSIVE METABOLIC PANEL
ALT: 30 U/L (ref 0–44)
AST: 90 U/L — ABNORMAL HIGH (ref 15–41)
Albumin: 4 g/dL (ref 3.5–5.0)
Alkaline Phosphatase: 81 U/L (ref 38–126)
Anion gap: 8 (ref 5–15)
BUN: 18 mg/dL (ref 6–20)
CO2: 24 mmol/L (ref 22–32)
Calcium: 8.8 mg/dL — ABNORMAL LOW (ref 8.9–10.3)
Chloride: 106 mmol/L (ref 98–111)
Creatinine, Ser: 0.82 mg/dL (ref 0.44–1.00)
GFR, Estimated: >60 mL/min (ref >60)
Glucose, Bld: 128 mg/dL — ABNORMAL HIGH (ref 70–99)
Potassium: 3.8 mmol/L (ref 3.5–5.1)
Sodium: 138 mmol/L (ref 135–145)
Total Bilirubin: 0.3 mg/dL (ref 0.3–1.2)
Total Protein: 7.8 g/dL (ref 6.5–8.1)

## 2022-09-22 LAB — TROPONIN I (HIGH SENSITIVITY)
Troponin I (High Sensitivity): <2 ng/L (ref <18)
Troponin I (High Sensitivity): 2 ng/L (ref <18)

## 2022-09-22 LAB — PROTIME-INR
INR: 0.9 (ref 0.8–1.2)
Prothrombin Time: 12.8 seconds (ref 11.4–15.2)

## 2022-09-22 LAB — LIPASE, BLOOD: Lipase: 27 U/L (ref 11–51)

## 2022-09-22 LAB — D-DIMER, QUANTITATIVE: D-Dimer, Quant: <0.27 ug/mL-FEU (ref 0.00–0.50)

## 2022-09-22 MED ORDER — LIDOCAINE VISCOUS HCL 2 % MT SOLN
15.0000 mL | Freq: Once | OROMUCOSAL | Status: AC
  Administered 2022-09-22: 15 mL via ORAL
  Filled 2022-09-22: qty 15

## 2022-09-22 MED ORDER — METOCLOPRAMIDE HCL 5 MG/ML IJ SOLN
10.0000 mg | Freq: Once | INTRAMUSCULAR | Status: AC
  Administered 2022-09-22: 10 mg via INTRAVENOUS
  Filled 2022-09-22: qty 2

## 2022-09-22 MED ORDER — ALUM & MAG HYDROXIDE-SIMETH 200-200-20 MG/5ML PO SUSP
30.0000 mL | Freq: Once | ORAL | Status: AC
  Administered 2022-09-22: 30 mL via ORAL
  Filled 2022-09-22: qty 30

## 2022-09-22 MED ORDER — IOHEXOL 350 MG/ML SOLN: 100.0000 mL | Freq: Once | INTRAVENOUS | Status: DC | PRN

## 2022-09-22 MED ORDER — MORPHINE SULFATE (PF) 4 MG/ML IV SOLN
4.0000 mg | Freq: Once | INTRAVENOUS | Status: AC
  Administered 2022-09-22: 4 mg via INTRAVENOUS
  Filled 2022-09-22: qty 1

## 2022-09-22 MED ORDER — ONDANSETRON HCL 4 MG/2ML IJ SOLN
4.0000 mg | Freq: Once | INTRAMUSCULAR | Status: AC
  Administered 2022-09-22: 4 mg via INTRAVENOUS
  Filled 2022-09-22: qty 2

## 2022-09-22 NOTE — Discharge Instructions (Addendum)
Your history, exam, workup today were overall reassuring.  We feel you are safe for discharge home with outpatient follow-up with primary doctor, your GI team, and cardiology as well.  If any symptoms change or worsen acutely, please return to the nearest emergency room.  Please rest and stay hydrated and avoid spicy and greasy foods.

## 2022-09-22 NOTE — ED Notes (Signed)
Lab reports adding on lipase to previous drawn labs.

## 2022-09-22 NOTE — ED Notes (Signed)
Called lab to add on blood  

## 2022-09-22 NOTE — ED Provider Notes (Signed)
3:57 PM Care assumed from Dr. Suezanne Jacquet.  At time of transfer of care, patient is awaiting for results of delta troponin and lipase evaluation.  If workup is reassuring, plan of care to discharge home for outpatient PCP and cardiology follow-up.  4:40 PM Delta troponin negative.  Lipase nonelevated.  We discussed all the patient's findings including slightly elevated AST however she has no gallbladder and is not tender in her abdomen or epigastric area at this time.  We feel she is safe for discharge home and patient agrees.  She will follow-up with her PCP and will also give information for outpatient cardiology follow-up.  She had other questions or concerns and had improvement in symptoms.  Patient discharged in good condition understanding return precautions and follow-up instructions.   Clinical Impression: 1. Atypical chest pain     Disposition: Discharge  Condition: Good  I have discussed the results, Dx and Tx plan with the pt(& family if present). He/she/they expressed understanding and agree(s) with the plan. Discharge instructions discussed at great length. Strict return precautions discussed and pt &/or family have verbalized understanding of the instructions. No further questions at time of discharge.    New Prescriptions   No medications on file    Follow Up: Melida Quitter, PA [address] [place] [address] [phone]  Today   [facility] Emergency Department at [facility] [address] [id number] [address] [place] [address] [phone]    [facility] A DEPT OF [place]. [facility] [address] [place] [address] [phone]    your Gastroenterologist          Bernarda Erck, Canary Brim, MD 09/22/22 [phone]
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
## 2022-09-22 NOTE — ED Triage Notes (Signed)
Pt reports mid CP w/ radiation to LT chest that started while at work; she then had nausea and diaphoresis; sts CP improved a little after she vomited, but is still present

## 2022-10-12 ENCOUNTER — Ambulatory Visit: Payer: BC Managed Care – PPO | Admitting: Family Medicine

## 2022-10-12 ENCOUNTER — Encounter: Payer: Self-pay | Admitting: Family Medicine

## 2022-10-12 VITALS — BP 143/87 | HR 71 | Resp 18 | Ht 65.0 in | Wt 159.0 lb

## 2022-10-12 DIAGNOSIS — F909 Attention-deficit hyperactivity disorder, unspecified type: Secondary | ICD-10-CM

## 2022-10-12 DIAGNOSIS — F32A Depression, unspecified: Secondary | ICD-10-CM

## 2022-10-12 MED ORDER — ARIPIPRAZOLE 2 MG PO TABS
2.0000 mg | ORAL_TABLET | Freq: Every day | ORAL | 2 refills | Status: AC
Start: 2022-10-12 — End: ?

## 2022-10-12 MED ORDER — VYVANSE 70 MG PO CAPS
70.0000 mg | ORAL_CAPSULE | Freq: Every day | ORAL | 0 refills | Status: DC
Start: 2022-10-12 — End: 2022-11-07

## 2022-10-12 NOTE — Assessment & Plan Note (Addendum)
Concerta is no longer working for her.  She has now tried and failed Adderall, and 2 different types of methylphenidate including Concerta.  Recommend restarting Vyvanse as that has worked the best for her out of everything that she has ever tried.  She was stable on it for several years until insurance stopped covering it and required her to discontinue.  Restart Vyvanse 70 mg daily.

## 2022-10-12 NOTE — Assessment & Plan Note (Signed)
Continue Prozac 40 mg daily, Wellbutrin 300 mg daily.  Patient only takes BuSpar at 10 mg once daily but reports that her anxiety does not tend to be bothersome.  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  We discussed the mechanism of action and possible side effects.  Patient is agreeable to trial, will follow-up in 6 weeks.  We can increase to 5 mg if not fully effective at that time.

## 2022-10-12 NOTE — Progress Notes (Signed)
Established Patient Office Visit  Subjective   Patient ID: Laurie Arnold, female    DOB: 1965-12-08  Age: 57 y.o. MRN: [id number]  Chief Complaint  Patient presents with   Anxiety   ADHD   Depression    HPI Laurie Arnold is a 57 y.o. female presenting today for follow up of mood, ADHD.  She has felt that about 6 weeks ago, she suddenly reached a point where she feels like she is not even being treated for ADHD or depression/anxiety.  She wonders if it may be correlated to her 9-hour surgery under general anesthesia.  She would like to discuss what changes might be made to help her mood to improve and her focus to improve as it is now affecting her ability to fulfill her job responsibilities.  She does have an upcoming appointment with a psychiatrist as well. ADHD: Has been taking Concerta, finds that it is ineffective for her focus. Denies any problems with insomnia, chest pain, palpitations, or SOB.  She has now tried and failed Adderall, 2 brands of methylphenidate including Concerta.  Vyvanse has worked for her very well in the past until she had to discontinue because insurance stopped covering it. Mood: Patient is here to follow up for depression, currently managing with Prozac, BuSpar, Wellbutrin. Taking medication without side effects, reports excellent compliance with treatment. Denies mood changes or SI/HI. She feels mood is worse since last visit.      07/12/2022    4:21 PM 03/26/2022   10:29 AM 12/22/2021    4:04 PM  Depression screen PHQ 2/9  Decreased Interest 0 0 0  Down, Depressed, Hopeless 0 0 0  PHQ - 2 Score 0 0 0  Altered sleeping 0 0 0  Tired, decreased energy 0  0  Change in appetite 0 0 0  Feeling bad or failure about yourself  0 0 0  Trouble concentrating 0 0 0  Moving slowly or fidgety/restless 0 0 0  Suicidal thoughts 0 0 0  PHQ-9 Score 0 0 0  Difficult doing work/chores Not difficult at all Not difficult at all        07/12/2022    4:21 PM 03/26/2022    10:29 AM 09/15/2021    4:00 PM 06/18/2021    9:05 AM  GAD 7 : Generalized Anxiety Score  Nervous, Anxious, on Edge 0 0 0 0  Control/stop worrying 0 0 0 0  Worry too much - different things 0 0 0 0  Trouble relaxing 0 0 0 0  Restless 0 0 0 0  Easily annoyed or irritable 0 0 0 0  Afraid - awful might happen 0 0 0 0  Total GAD 7 Score 0 0 0 0  Anxiety Difficulty Not difficult at all Not difficult at all Not difficult at all Not difficult at all   ROS Negative unless otherwise noted in HPI   Objective:     BP (!) 143/87 (BP Location: Right Arm, Patient Position: Sitting, Cuff Size: Normal)   Pulse 71   Resp 18   Ht 5\' 5"  (1.651 m)   Wt 159 lb (72.1 kg)   SpO2 98%   BMI 26.46 kg/m   Physical Exam Constitutional:      General: She is not in acute distress.    Appearance: Normal appearance.  HENT:     Head: Normocephalic and atraumatic.  Pulmonary:     Effort: Pulmonary effort is normal. No respiratory distress.  Musculoskeletal:  Cervical back: Normal range of motion.  Neurological:     General: No focal deficit present.     Mental Status: She is alert and oriented to person, place, and time. Mental status is at baseline.  Psychiatric:        Mood and Affect: Mood normal.        Thought Content: Thought content normal.        Judgment: Judgment normal.     Assessment & Plan:  Depression, unspecified depression type Assessment & Plan: Continue Prozac 40 mg daily, Wellbutrin 300 mg daily.  Patient only takes BuSpar at 10 mg once daily but reports that her anxiety does not tend to be bothersome.  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  We discussed the mechanism of action and possible side effects.  Patient is agreeable to trial, will follow-up in 6 weeks.  We can increase to 5 mg if not fully effective at that time.  Orders: -     ARIPiprazole; Take 1 tablet (2 mg total) by mouth daily.  Dispense: 30 tablet; Refill: 2  Attention  deficit hyperactivity disorder (ADHD), unspecified ADHD type Assessment & Plan: Concerta is no longer working for her.  She has now tried and failed Adderall, and 2 different types of methylphenidate including Concerta.  Recommend restarting Vyvanse as that has worked the best for her out of everything that she has ever tried.  She was stable on it for several years until insurance stopped covering it and required her to discontinue.  Restart Vyvanse 70 mg daily.  Orders: -     Vyvanse; Take 1 capsule (70 mg total) by mouth daily.  Dispense: 30 capsule; Refill: 0    Return in about 6 weeks (around 11/23/2022) for follow-up for ADHD and mood.    Melida Quitter, PA

## 2022-11-07 ENCOUNTER — Other Ambulatory Visit: Payer: Self-pay | Admitting: Family Medicine

## 2022-11-07 DIAGNOSIS — F909 Attention-deficit hyperactivity disorder, unspecified type: Secondary | ICD-10-CM

## 2022-11-08 MED ORDER — VYVANSE 70 MG PO CAPS
70.0000 mg | ORAL_CAPSULE | Freq: Every day | ORAL | 0 refills | Status: DC
Start: 2022-11-08 — End: 2022-12-02

## 2022-11-12 ENCOUNTER — Encounter: Payer: Self-pay | Admitting: Internal Medicine

## 2022-11-12 ENCOUNTER — Ambulatory Visit: Payer: BC Managed Care – PPO | Attending: Internal Medicine | Admitting: Internal Medicine

## 2022-11-23 ENCOUNTER — Ambulatory Visit: Payer: BC Managed Care – PPO | Admitting: Family Medicine

## 2022-11-24 ENCOUNTER — Telehealth: Payer: Self-pay | Admitting: *Deleted

## 2022-11-24 DIAGNOSIS — B029 Zoster without complications: Secondary | ICD-10-CM | POA: Diagnosis not present

## 2022-11-24 NOTE — Telephone Encounter (Signed)
Chandler from [facility] calling to see if it is ok to switch patients thyroid medication to the amneal brand due to them not carrying the mylan brand.  Per provider she said it is ok to switch. Routing as an Financial planner.

## 2022-11-25 ENCOUNTER — Encounter: Payer: Self-pay | Admitting: General Practice

## 2022-12-02 ENCOUNTER — Other Ambulatory Visit: Payer: Self-pay | Admitting: Family Medicine

## 2022-12-02 DIAGNOSIS — F909 Attention-deficit hyperactivity disorder, unspecified type: Secondary | ICD-10-CM

## 2022-12-03 MED ORDER — VYVANSE 70 MG PO CAPS
70.0000 mg | ORAL_CAPSULE | Freq: Every day | ORAL | 0 refills | Status: DC
Start: 2022-12-03 — End: 2023-01-13

## 2023-01-13 ENCOUNTER — Other Ambulatory Visit: Payer: Self-pay | Admitting: Family Medicine

## 2023-01-13 DIAGNOSIS — F909 Attention-deficit hyperactivity disorder, unspecified type: Secondary | ICD-10-CM

## 2023-01-13 MED ORDER — VYVANSE 70 MG PO CAPS
70.0000 mg | ORAL_CAPSULE | Freq: Every day | ORAL | 0 refills | Status: AC
Start: 2023-01-15 — End: 2023-02-14

## 2023-01-27 NOTE — Progress Notes (Deleted)
Cardiology Office Note:  .   Date:  01/27/2023  ID:  Laurie Arnold, DOB Aug 16, 1965, MRN [id number] PCP: Melida Quitter, PA  [facility] Providers Cardiologist:  None { Click to update primary MD,subspecialty MD or APP then REFRESH:1}   History of Present Illness: .   Laurie Arnold is a 57 y.o. female hx of GERD referral for CP . EKG in May shows NSR. She was seen in the ED at that time. Trop was negative. She was referred here.   ROS:  per HPI otherwise negative   Studies Reviewed: .        *** Risk Assessment/Calculations:     Physical Exam:   VS:   *** Wt Readings from Last 3 Encounters:  10/12/22 159 lb (72.1 kg)  09/22/22 155 lb (70.3 kg)  07/12/22 151 lb (68.5 kg)    GEN: Well nourished, well developed in no acute distress NECK: No JVD; No carotid bruits CARDIAC: ***RRR, no murmurs, rubs, gallops RESPIRATORY:  Clear to auscultation without rales, wheezing or rhonchi  ABDOMEN: Soft, non-tender, non-distended EXTREMITIES:  No edema; No deformity   ASSESSMENT AND PLAN: .   ***     Dispo: ***  Signed, Maisie Fus, MD

## 2023-01-28 ENCOUNTER — Ambulatory Visit: Payer: BC Managed Care – PPO | Attending: Internal Medicine | Admitting: Internal Medicine

## 2023-01-31 ENCOUNTER — Encounter: Payer: Self-pay | Admitting: Internal Medicine

## 2023-01-31 ENCOUNTER — Ambulatory Visit: Payer: BC Managed Care – PPO | Admitting: Family Medicine

## 2023-01-31 NOTE — Progress Notes (Deleted)
Established Patient Office Visit  Subjective   Patient ID: Laurie Arnold, female    DOB: May 24, 1965  Age: 57 y.o. MRN: [id number]  No chief complaint on file.   HPI Laurie Arnold is a 57 y.o. female presenting today for follow up of ADHD, mood. ADHD: Reports symptoms are well controlled on Vyvanse 70 mg daily. Denies any problems with insomnia, chest pain, palpitations, or SOB.   Mood: Patient is here to follow up for depression, currently managing with Prozac 40 mg daily, buspirone 10 mg daily, bupropion 300 mg daily, Abilify 2 mg daily. Taking medication without side effects, reports {excellent/good/fair/poor:19665} compliance with treatment. Denies mood changes or SI/HI. She feels mood is {improved/worse/stable:29130} since last visit. Denies chest pain, difficulty concentrating, dizziness, fatigue, insomnia, irritability, palpitations, panic attacks, racing thoughts, SOB, sweating. Denies anhedonia, depressed mood, difficulty concentrating, fatigue, feelings of worthlessness/guilt, hopelessness, hypersomnia, impaired memory, insomnia, psychomotor agitation, psychomotor retardation, recurrent thoughts of death, weight changes.     Jul 25, 2022    4:21 PM 03/26/2022   10:29 AM 12/22/2021    4:04 PM  Depression screen PHQ 2/9  Decreased Interest 0 0 0  Down, Depressed, Hopeless 0 0 0  PHQ - 2 Score 0 0 0  Altered sleeping 0 0 0  Tired, decreased energy 0  0  Change in appetite 0 0 0  Feeling bad or failure about yourself  0 0 0  Trouble concentrating 0 0 0  Moving slowly or fidgety/restless 0 0 0  Suicidal thoughts 0 0 0  PHQ-9 Score 0 0 0  Difficult doing work/chores Not difficult at all Not difficult at all        2022/07/25    4:21 PM 03/26/2022   10:29 AM 09/15/2021    4:00 PM 06/18/2021    9:05 AM  GAD 7 : Generalized Anxiety Score  Nervous, Anxious, on Edge 0 0 0 0  Control/stop worrying 0 0 0 0  Worry too much - different things 0 0 0 0  Trouble relaxing 0 0 0 0  Restless  0 0 0 0  Easily annoyed or irritable 0 0 0 0  Afraid - awful might happen 0 0 0 0  Total GAD 7 Score 0 0 0 0  Anxiety Difficulty Not difficult at all Not difficult at all Not difficult at all Not difficult at all   Outpatient Medications Prior to Visit  Medication Sig   acetaminophen (TYLENOL) 650 MG CR tablet Take 1,950 mg by mouth daily.   ARIPiprazole (ABILIFY) 2 MG tablet Take 1 tablet (2 mg total) by mouth daily.   aspirin EC 81 MG tablet Take 81 mg by mouth daily. Swallow whole.   buPROPion (WELLBUTRIN XL) 300 MG 24 hr tablet Take 1 tablet (300 mg total) by mouth daily.   busPIRone (BUSPAR) 10 MG tablet Take 1 tablet (10 mg total) by mouth daily.   cetirizine (ZYRTEC) 10 MG tablet Take 10 mg by mouth daily.   famotidine (PEPCID) 20 MG tablet Take 20 mg by mouth daily.    FLUoxetine (PROZAC) 40 MG capsule Take 1 capsule (40 mg total) by mouth daily.   gabapentin (NEURONTIN) 600 MG tablet Take 1 tablet (600 mg total) by mouth 2 (two) times daily.   levothyroxine (SYNTHROID) 88 MCG tablet Take 1 tablet (88 mcg total) by mouth daily.   ondansetron (ZOFRAN) 4 MG tablet Take 1 tablet (4 mg total) by mouth every 8 (eight) hours as needed for nausea or vomiting.  pantoprazole (PROTONIX) 20 MG tablet Take 1 tablet (20 mg total) by mouth daily.   VYVANSE 70 MG capsule Take 1 capsule (70 mg total) by mouth daily.   No facility-administered medications prior to visit.    ROS Negative unless otherwise noted in HPI   Objective:     There were no vitals taken for this visit.  Physical Exam  Assessment & Plan:  There are no diagnoses linked to this encounter.  No follow-ups on file.    Melida Quitter, PA

## 2023-02-11 ENCOUNTER — Other Ambulatory Visit: Payer: Self-pay | Admitting: *Deleted

## 2023-02-11 DIAGNOSIS — F909 Attention-deficit hyperactivity disorder, unspecified type: Secondary | ICD-10-CM

## 2023-02-11 MED ORDER — VYVANSE 70 MG PO CAPS
70.0000 mg | ORAL_CAPSULE | Freq: Every day | ORAL | 0 refills | Status: DC
Start: 2023-02-11 — End: 2023-02-17

## 2023-02-17 ENCOUNTER — Telehealth: Payer: BC Managed Care – PPO | Admitting: Family Medicine

## 2023-02-17 ENCOUNTER — Encounter: Payer: Self-pay | Admitting: Family Medicine

## 2023-02-17 DIAGNOSIS — E7800 Pure hypercholesterolemia, unspecified: Secondary | ICD-10-CM

## 2023-02-17 DIAGNOSIS — F339 Major depressive disorder, recurrent, unspecified: Secondary | ICD-10-CM | POA: Diagnosis not present

## 2023-02-17 DIAGNOSIS — F909 Attention-deficit hyperactivity disorder, unspecified type: Secondary | ICD-10-CM | POA: Diagnosis not present

## 2023-02-17 DIAGNOSIS — E039 Hypothyroidism, unspecified: Secondary | ICD-10-CM | POA: Diagnosis not present

## 2023-02-17 DIAGNOSIS — R5383 Other fatigue: Secondary | ICD-10-CM

## 2023-02-17 DIAGNOSIS — Z6826 Body mass index (BMI) 26.0-26.9, adult: Secondary | ICD-10-CM

## 2023-02-17 DIAGNOSIS — Z1159 Encounter for screening for other viral diseases: Secondary | ICD-10-CM

## 2023-02-17 MED ORDER — FLUOXETINE HCL 40 MG PO CAPS: 40.0000 mg | ORAL_CAPSULE | Freq: Every day | ORAL | 1 refills | Status: DC

## 2023-02-17 MED ORDER — BUPROPION HCL ER (XL) 300 MG PO TB24: 300.0000 mg | ORAL_TABLET | Freq: Every day | ORAL | 1 refills | Status: DC

## 2023-02-17 MED ORDER — VYVANSE 70 MG PO CAPS
70.0000 mg | ORAL_CAPSULE | Freq: Every day | ORAL | 0 refills | Status: DC
Start: 2023-02-17 — End: 2023-05-13

## 2023-02-17 MED ORDER — VYVANSE 70 MG PO CAPS
70.0000 mg | ORAL_CAPSULE | Freq: Every day | ORAL | 0 refills | Status: DC
Start: 2023-03-20 — End: 2023-06-28

## 2023-02-17 MED ORDER — VYVANSE 70 MG PO CAPS
70.0000 mg | ORAL_CAPSULE | Freq: Every day | ORAL | 0 refills | Status: DC
Start: 2023-04-19 — End: 2023-06-28

## 2023-02-17 MED ORDER — BUSPIRONE HCL 10 MG PO TABS: 10.0000 mg | ORAL_TABLET | Freq: Every day | ORAL | 1 refills | Status: DC

## 2023-02-17 NOTE — Assessment & Plan Note (Signed)
Continue Vyvanse 70 mg daily, agreeable to referral to psychiatry for further medication monitoring to optimize mental health medications.

## 2023-02-17 NOTE — Progress Notes (Signed)
Virtual Visit via Video Note  I connected with Laurie Arnold on 02/17/23 at  3:10 PM EDT by a video enabled telemedicine application and verified that I am speaking with the correct person using two identifiers.  Patient Location: Home Provider Location: Office/clinic  I discussed the limitations, risks, security, and privacy concerns of performing an evaluation and management service by video and the availability of in person appointments. I also discussed with the patient that there may be a patient responsible charge related to this service. The patient expressed understanding and agreed to proceed.    Subjective   Patient ID: Laurie Arnold, female    DOB: 02/22/1966  Age: 57 y.o. MRN: [id number]  No chief complaint on file.   HPI Ian Cavey is a 57 y.o. female presenting today for follow up of ADHD, mood.  She has been feeling very fatigued and has noticed easy bruising recently. ADHD: Reports symptoms are not well controlled on Vyvanse.  She has not noticed any improvement in her focus since switching from Concerta to Vyvanse. Denies any problems with insomnia, chest pain, palpitations, or SOB.   Mood: Patient is here to follow up for depression and anxiety, currently managing with Prozac 40 mg daily, buspirone 10 mg daily, bupropion 300 mg daily.  Augmenting with Abilify 2 mg daily was ineffective, so she took herself off of it. Taking medication without side effects, reports excellent compliance with treatment. Denies mood changes or SI/HI. She feels mood is  not improved  since last visit.  ROS Negative unless otherwise noted in HPI  Outpatient Medications Prior to Visit  Medication Sig   acetaminophen (TYLENOL) 650 MG CR tablet Take 1,950 mg by mouth daily.   aspirin EC 81 MG tablet Take 81 mg by mouth daily. Swallow whole.   buPROPion (WELLBUTRIN XL) 300 MG 24 hr tablet Take 1 tablet (300 mg total) by mouth daily.   busPIRone (BUSPAR) 10 MG tablet Take 1 tablet (10 mg  total) by mouth daily.   cetirizine (ZYRTEC) 10 MG tablet Take 10 mg by mouth daily.   famotidine (PEPCID) 20 MG tablet Take 20 mg by mouth daily.    FLUoxetine (PROZAC) 40 MG capsule Take 1 capsule (40 mg total) by mouth daily.   gabapentin (NEURONTIN) 600 MG tablet Take 1 tablet (600 mg total) by mouth 2 (two) times daily.   levothyroxine (SYNTHROID) 88 MCG tablet Take 1 tablet (88 mcg total) by mouth daily.   ondansetron (ZOFRAN) 4 MG tablet Take 1 tablet (4 mg total) by mouth every 8 (eight) hours as needed for nausea or vomiting.   pantoprazole (PROTONIX) 20 MG tablet Take 1 tablet (20 mg total) by mouth daily.   VYVANSE 70 MG capsule Take 1 capsule (70 mg total) by mouth daily.   [DISCONTINUED] ARIPiprazole (ABILIFY) 2 MG tablet Take 1 tablet (2 mg total) by mouth daily.   No facility-administered medications prior to visit.     Objective:     Physical Exam General: Speaking clearly in complete sentences without any shortness of breath.  Alert and oriented x3.  Normal judgment. No apparent acute distress.   Assessment & Plan:  Attention deficit hyperactivity disorder (ADHD), unspecified ADHD type Assessment & Plan: Continue Vyvanse 70 mg daily, agreeable to referral to psychiatry for further medication monitoring to optimize mental health medications.   Chronic recurrent major depressive disorder (HCC) Assessment & Plan: Continue Prozac 40 mg daily, Wellbutrin 300 mg daily, buspirone 10 mg daily.  Patient agreeable to  referral to psychiatry for further evaluation and medication management.   Hypothyroidism, unspecified type -     TSH; Future -     T4, free; Future  Fatigue, unspecified type -     CBC with Differential/Platelet; Future -     Comprehensive metabolic panel; Future  Will also check thyroid labs, CBC for anemia or change in platelet levels, CMP for electrolyte or endocrine abnormality.  Return in about 3 months (around 05/20/2023) for annual physical, fasting  blood work 1 week before.   I discussed the assessment and treatment plan with the patient. The patient was provided an opportunity to ask questions, and all were answered. The patient agreed with the plan and demonstrated an understanding of the instructions.   The patient was advised to call back or seek an in-person evaluation if the symptoms worsen or if the condition fails to improve as anticipated.  The above assessment and management plan was discussed with the patient. The patient verbalized understanding of and has agreed to the management plan.   Melida Quitter, PA

## 2023-02-17 NOTE — Assessment & Plan Note (Signed)
Continue Prozac 40 mg daily, Wellbutrin 300 mg daily, buspirone 10 mg daily.  Patient agreeable to referral to psychiatry for further evaluation and medication management.

## 2023-04-13 ENCOUNTER — Telehealth: Payer: Self-pay

## 2023-04-13 DIAGNOSIS — G629 Polyneuropathy, unspecified: Secondary | ICD-10-CM

## 2023-04-13 NOTE — Telephone Encounter (Signed)
Copied from CRM [id number]. Topic: Clinical - Medication Refill >> Apr 13, 2023 11:39 AM Donita Brooks wrote: Most Recent Primary Care Visit:  Provider: Saralyn Pilar A  Department: [facility]  Visit Type: MYCHART VIDEO VISIT  Date: 02/17/2023  Medication: gabapentin (NEURONTIN) 600 MG  Has the patient contacted their pharmacy? Yes (Agent: If no, request that the patient contact the pharmacy for the refill. If patient does not wish to contact the pharmacy document the reason why and proceed with request.) (Agent: If yes, when and what did the pharmacy advise?)  Is this the correct pharmacy for this prescription? Yes If no, delete pharmacy and type the correct one.  This is the patient's preferred pharmacy:  [facility] [id number] [place], [place] - [address] [place] [address] Phone: [phone] Fax: [phone]   Has the prescription been filled recently? No  Is the patient out of the medication? Yes  Has the patient been seen for an appointment in the last year OR does the patient have an upcoming appointment? No  Can we respond through MyChart? Yes  Agent: Please be advised that Rx refills may take up to 3 business days. We ask that you follow-up with your pharmacy.

## 2023-04-13 NOTE — Telephone Encounter (Signed)
Office is required for refill on Gabapentin

## 2023-05-13 ENCOUNTER — Other Ambulatory Visit: Payer: Self-pay | Admitting: Family Medicine

## 2023-05-13 DIAGNOSIS — F909 Attention-deficit hyperactivity disorder, unspecified type: Secondary | ICD-10-CM

## 2023-05-13 MED ORDER — VYVANSE 70 MG PO CAPS
70.0000 mg | ORAL_CAPSULE | Freq: Every day | ORAL | 0 refills | Status: DC
Start: 2023-05-13 — End: 2023-06-28

## 2023-06-17 ENCOUNTER — Other Ambulatory Visit: Payer: Self-pay | Admitting: Family Medicine

## 2023-06-17 DIAGNOSIS — K219 Gastro-esophageal reflux disease without esophagitis: Secondary | ICD-10-CM

## 2023-06-17 DIAGNOSIS — F339 Major depressive disorder, recurrent, unspecified: Secondary | ICD-10-CM

## 2023-06-17 DIAGNOSIS — F909 Attention-deficit hyperactivity disorder, unspecified type: Secondary | ICD-10-CM

## 2023-06-17 DIAGNOSIS — G629 Polyneuropathy, unspecified: Secondary | ICD-10-CM

## 2023-06-17 DIAGNOSIS — E039 Hypothyroidism, unspecified: Secondary | ICD-10-CM

## 2023-06-17 MED ORDER — BUSPIRONE HCL 10 MG PO TABS
10.0000 mg | ORAL_TABLET | Freq: Every day | ORAL | 1 refills | Status: DC
Start: 2023-06-17 — End: 2023-06-22

## 2023-06-17 MED ORDER — LEVOTHYROXINE SODIUM 88 MCG PO TABS
88.0000 ug | ORAL_TABLET | Freq: Every day | ORAL | 1 refills | Status: DC
Start: 2023-06-17 — End: 2023-06-22

## 2023-06-17 MED ORDER — BUPROPION HCL ER (XL) 300 MG PO TB24
300.0000 mg | ORAL_TABLET | Freq: Every day | ORAL | 1 refills | Status: DC
Start: 2023-06-17 — End: 2023-06-22

## 2023-06-17 MED ORDER — GABAPENTIN 600 MG PO TABS: 600.0000 mg | ORAL_TABLET | Freq: Two times a day (BID) | ORAL | 1 refills | Status: DC

## 2023-06-17 MED ORDER — PANTOPRAZOLE SODIUM 20 MG PO TBEC: 20.0000 mg | DELAYED_RELEASE_TABLET | Freq: Every day | ORAL | 1 refills | Status: DC

## 2023-06-17 NOTE — Telephone Encounter (Signed)
Copied from CRM [id number]. Topic: Clinical - Medication Refill >> Jun 17, 2023 11:34 AM Antony Haste wrote: Most Recent Primary Care Visit:  Provider: Saralyn Pilar A  Department: [facility]  Visit Type: MYCHART VIDEO VISIT  Date: 02/17/2023  Medication:  buPROPion (WELLBUTRIN XL) 300 MG 24 hr tablet busPIRone (BUSPAR) 10 MG tablet  pantoprazole (PROTONIX) 20 MG tablet levothyroxine (SYNTHROID) 88 MCG tablet VYVANSE 70 MG capsule gabapentin (NEURONTIN) 600 MG tablet  Has the patient contacted their pharmacy? Yes (Agent: If no, request that the patient contact the pharmacy for the refill. If patient does not wish to contact the pharmacy document the reason why and proceed with request.) (Agent: If yes, when and what did the pharmacy advise?)  Is this the correct pharmacy for this prescription? Yes If no, delete pharmacy and type the correct one.  This is the patient's preferred pharmacy:  [facility] [phone] [place], [place] - [address],  [address] [place] [address] Phone: [phone] Fax:    Has the prescription been filled recently? No, PT has new insurance.  Is the patient out of the medication? Yes  Has the patient been seen for an appointment in the last year OR does the patient have an upcoming appointment? No  Can we respond through MyChart? No  Agent: Please be advised that Rx refills may take up to 3 business days. We ask that you follow-up with your pharmacy.

## 2023-06-17 NOTE — Telephone Encounter (Signed)
Last Fill: Wellbutrin: 02/17/23     Vyvanse: 05/13/23     Buspar: 02/17/23     Gabapentin: 03/26/22     Synthroid: 07/12/22     Protonix: 07/12/22  Last OV: 02/17/23 Next OV: None Scheduled  Routing to provider for review/authorization.

## 2023-06-17 NOTE — Telephone Encounter (Signed)
Patient needs an appointment for refill of Vyvanse

## 2023-06-20 ENCOUNTER — Other Ambulatory Visit: Payer: Self-pay | Admitting: Family Medicine

## 2023-06-20 DIAGNOSIS — F909 Attention-deficit hyperactivity disorder, unspecified type: Secondary | ICD-10-CM

## 2023-06-20 NOTE — Telephone Encounter (Signed)
Patient's last appointment was on 02/17/2023.  At that time, she was instructed to return in about 3 months (around 05/20/2023) for annual physical, fasting blood work 1 week before.  Since it has been more than 3 months, she does need to schedule follow-up appointment in order to receive further refills of controlled substances

## 2023-06-21 ENCOUNTER — Other Ambulatory Visit: Payer: Self-pay | Admitting: Family Medicine

## 2023-06-21 DIAGNOSIS — K219 Gastro-esophageal reflux disease without esophagitis: Secondary | ICD-10-CM

## 2023-06-21 DIAGNOSIS — G629 Polyneuropathy, unspecified: Secondary | ICD-10-CM

## 2023-06-21 DIAGNOSIS — F909 Attention-deficit hyperactivity disorder, unspecified type: Secondary | ICD-10-CM

## 2023-06-21 DIAGNOSIS — E039 Hypothyroidism, unspecified: Secondary | ICD-10-CM

## 2023-06-21 DIAGNOSIS — F339 Major depressive disorder, recurrent, unspecified: Secondary | ICD-10-CM

## 2023-06-21 NOTE — Telephone Encounter (Signed)
 Last Fill: Wellbutrin :06/17/23     Buspar :06/17/23     Gabapentin :06/17/23     Prozac :02/17/23     Synthroid :06/17/23     Protonix :06/17/23     Vyvanse :05/13/23  Last OV: 02/17/23 Next OV: None Scheduled  Routing to provider for review/authorization.

## 2023-06-21 NOTE — Telephone Encounter (Signed)
 Copied from CRM [id number]. Topic: Clinical - Medication Refill >> Jun 21, 2023  3:58 PM [name] wrote: Most Recent Primary Care Visit:  Provider: WALLACE SEARCH A  Department: [facility]  Visit Type: MYCHART VIDEO VISIT  Date: 02/17/2023  Medication: FLUoxetine  (PROZAC ) 40 MG capsule, buPROPion  (WELLBUTRIN  XL) 300 MG 24 hr tablet, gabapentin  (NEURONTIN ) 600 MG tablet, levothyroxine  (SYNTHROID ) 88 MCG tablet, pantoprazole  (PROTONIX ) 20 MG tablet, busPIRone  (BUSPAR ) 10 MG tablet Has the patient contacted their pharmacy? Yes (Agent: If no, request that the patient contact the pharmacy for the refill. If patient does not wish to contact the pharmacy document the reason why and proceed with request.) (Agent: If yes, when and what did the pharmacy advise?)  Is this the correct pharmacy for this prescription? Yes If no, delete pharmacy and type the correct one.  This is the patient's preferred pharmacy:    [id number] [place], [place] - [address] [place] [address] Phone: [phone] Fax: [phone]   Has the prescription been filled recently? Yes  Is the patient out of the medication? Yes  Has the patient been seen for an appointment in the last year OR does the patient have an upcoming appointment? Yes  Can we respond through MyChart? Yes  Agent: Please be advised that Rx refills may take up to 3 business days. We ask that you follow-up with your pharmacy.

## 2023-06-22 ENCOUNTER — Ambulatory Visit: Payer: BC Managed Care – PPO | Admitting: Family Medicine

## 2023-06-22 MED ORDER — BUSPIRONE HCL 10 MG PO TABS: 10.0000 mg | ORAL_TABLET | Freq: Every day | ORAL | 1 refills | Status: AC

## 2023-06-22 MED ORDER — FLUOXETINE HCL 40 MG PO CAPS: 40.0000 mg | ORAL_CAPSULE | Freq: Every day | ORAL | 1 refills | Status: AC

## 2023-06-22 MED ORDER — BUPROPION HCL ER (XL) 300 MG PO TB24: 300.0000 mg | ORAL_TABLET | Freq: Every day | ORAL | 1 refills | Status: AC

## 2023-06-22 MED ORDER — GABAPENTIN 600 MG PO TABS: 600.0000 mg | ORAL_TABLET | Freq: Two times a day (BID) | ORAL | 1 refills | Status: AC

## 2023-06-22 MED ORDER — PANTOPRAZOLE SODIUM 20 MG PO TBEC: 20.0000 mg | DELAYED_RELEASE_TABLET | Freq: Every day | ORAL | 1 refills | Status: AC

## 2023-06-22 MED ORDER — FLUOXETINE HCL 40 MG PO CAPS: 40.0000 mg | ORAL_CAPSULE | Freq: Every day | ORAL | 1 refills | Status: DC

## 2023-06-22 MED ORDER — LEVOTHYROXINE SODIUM 88 MCG PO TABS: 88.0000 ug | ORAL_TABLET | Freq: Every day | ORAL | 1 refills | Status: AC

## 2023-06-22 NOTE — Telephone Encounter (Signed)
 Medications other than [facility]  [facility].  I cannot resend Vyvanse  because she needs an appointment for controlled substances

## 2023-06-23 ENCOUNTER — Encounter: Payer: Self-pay | Admitting: Family Medicine

## 2023-06-24 ENCOUNTER — Encounter: Payer: Self-pay | Admitting: Family Medicine

## 2023-06-24 ENCOUNTER — Ambulatory Visit: Payer: BC Managed Care – PPO | Admitting: Family Medicine

## 2023-06-28 ENCOUNTER — Other Ambulatory Visit: Payer: Self-pay | Admitting: Family Medicine

## 2023-06-28 DIAGNOSIS — F909 Attention-deficit hyperactivity disorder, unspecified type: Secondary | ICD-10-CM

## 2023-06-28 MED ORDER — VYVANSE 70 MG PO CAPS: 70.0000 mg | ORAL_CAPSULE | Freq: Every day | ORAL | 0 refills | Status: DC

## 2023-06-30 MED ORDER — VYVANSE 70 MG PO CAPS: 70.0000 mg | ORAL_CAPSULE | Freq: Every day | ORAL | 0 refills | Status: DC

## 2023-06-30 NOTE — Addendum Note (Signed)
Addended by: Saralyn Pilar on: 06/30/2023 01:57 PM   Modules accepted: Orders

## 2023-07-04 ENCOUNTER — Other Ambulatory Visit: Payer: Self-pay | Admitting: Family Medicine

## 2023-07-04 ENCOUNTER — Telehealth: Payer: Self-pay | Admitting: Family Medicine

## 2023-07-04 MED ORDER — LISDEXAMFETAMINE DIMESYLATE 70 MG PO CAPS: 70.0000 mg | ORAL_CAPSULE | Freq: Every day | ORAL | 0 refills | Status: AC

## 2023-07-04 NOTE — Telephone Encounter (Signed)
Copied from CRM [id number]. Topic: Clinical - Medication Question >> Jul 04, 2023 11:46 AM Clayton Bibles wrote: Reason for CRM: Rashmi wants to see if Dr. Jairo Ben will send in the generic for VYVANSE 70 MG capsule and make a note on the prescription that she has tried 3 other medications prior to Vyvanse required by insurance.  [facility] [address] [phone] is Rumi's call back number

## 2023-07-04 NOTE — Telephone Encounter (Signed)
[facility] [address]

## 2023-12-15 ENCOUNTER — Other Ambulatory Visit: Payer: Self-pay | Admitting: Family Medicine

## 2023-12-15 DIAGNOSIS — K219 Gastro-esophageal reflux disease without esophagitis: Secondary | ICD-10-CM

## 2023-12-15 DIAGNOSIS — F339 Major depressive disorder, recurrent, unspecified: Secondary | ICD-10-CM

## 2023-12-15 DIAGNOSIS — E039 Hypothyroidism, unspecified: Secondary | ICD-10-CM

## 2023-12-16 ENCOUNTER — Other Ambulatory Visit: Payer: Self-pay | Admitting: Family Medicine

## 2023-12-16 DIAGNOSIS — G629 Polyneuropathy, unspecified: Secondary | ICD-10-CM
